# Patient Record
Sex: Male | Born: 1960
Health system: Southern US, Community
[De-identification: ages and names within clinical notes are randomized; demographics above are authoritative.]

## PROBLEM LIST (undated history)

## (undated) DIAGNOSIS — H409 Unspecified glaucoma: Secondary | ICD-10-CM

## (undated) DIAGNOSIS — H269 Unspecified cataract: Secondary | ICD-10-CM

## (undated) DIAGNOSIS — E039 Hypothyroidism, unspecified: Secondary | ICD-10-CM

## (undated) DIAGNOSIS — T7840XA Allergy, unspecified, initial encounter: Secondary | ICD-10-CM

## (undated) HISTORY — PX: ROTATOR CUFF REPAIR: SHX139

## (undated) HISTORY — PX: HEMORRHOID SURGERY: SHX153

## (undated) HISTORY — PX: HERNIA REPAIR: SHX51

## (undated) HISTORY — PX: VASECTOMY: SHX75

## (undated) HISTORY — DX: Unspecified glaucoma: H40.9

## (undated) HISTORY — DX: Allergy, unspecified, initial encounter: T78.40XA

## (undated) HISTORY — DX: Unspecified cataract: H26.9

## (undated) HISTORY — PX: FRACTURE SURGERY: SHX138

---

## 2006-01-21 ENCOUNTER — Ambulatory Visit: Payer: Self-pay | Admitting: Orthopaedic Surgery

## 2006-03-27 ENCOUNTER — Ambulatory Visit: Payer: Self-pay | Admitting: Orthopaedic Surgery

## 2008-05-12 ENCOUNTER — Ambulatory Visit: Payer: Self-pay | Admitting: Unknown Physician Specialty

## 2008-05-12 LAB — HM COLONOSCOPY

## 2008-07-25 DIAGNOSIS — D239 Other benign neoplasm of skin, unspecified: Secondary | ICD-10-CM

## 2008-07-25 HISTORY — DX: Other benign neoplasm of skin, unspecified: D23.9

## 2013-10-22 ENCOUNTER — Ambulatory Visit: Payer: Self-pay | Admitting: Unknown Physician Specialty

## 2013-10-22 LAB — HM COLONOSCOPY

## 2013-10-25 LAB — PATHOLOGY REPORT

## 2013-12-07 DIAGNOSIS — C4491 Basal cell carcinoma of skin, unspecified: Secondary | ICD-10-CM

## 2013-12-07 HISTORY — DX: Basal cell carcinoma of skin, unspecified: C44.91

## 2014-03-23 LAB — CBC AND DIFFERENTIAL
HCT: 42 % (ref 41–53)
Hemoglobin: 14.4 g/dL (ref 13.5–17.5)
Neutrophils Absolute: 4 /uL
PLATELETS: 360 10*3/uL (ref 150–399)
WBC: 6.6 10^3/mL

## 2014-03-23 LAB — HEPATIC FUNCTION PANEL
ALK PHOS: 51 U/L (ref 25–125)
ALT: 27 U/L (ref 10–40)
AST: 22 U/L (ref 14–40)

## 2014-03-23 LAB — BASIC METABOLIC PANEL
BUN: 17 mg/dL (ref 4–21)
CREATININE: 1 mg/dL (ref 0.6–1.3)
Glucose: 87 mg/dL
Potassium: 4.4 mmol/L (ref 3.4–5.3)
Sodium: 142 mmol/L (ref 137–147)

## 2014-03-23 LAB — PSA: PSA: 0.5

## 2014-03-23 LAB — TSH: TSH: 3.08 u[IU]/mL (ref 0.41–5.90)

## 2014-12-05 ENCOUNTER — Other Ambulatory Visit: Payer: Self-pay | Admitting: Family Medicine

## 2015-01-05 ENCOUNTER — Telehealth: Payer: Self-pay | Admitting: Family Medicine

## 2015-01-05 NOTE — Telephone Encounter (Signed)
Patient needs refills

## 2015-01-05 NOTE — Telephone Encounter (Signed)
Okay to refill for 5 months. Thanks

## 2015-01-05 NOTE — Telephone Encounter (Signed)
Pt contacted office for refill request on the following medications: Ambien 10 mg & Synthroid 75 mcg to [facility]. Pt is scheduled to have his CPE on 03/28/15. Pt was last seen on 03/23/14 for CPE. Thanks TNP

## 2015-01-06 ENCOUNTER — Other Ambulatory Visit: Payer: Self-pay

## 2015-01-06 MED ORDER — LEVOTHYROXINE SODIUM 75 MCG PO TABS: 75.0000 ug | ORAL_TABLET | Freq: Every day | ORAL | Status: DC

## 2015-01-06 MED ORDER — ZOLPIDEM TARTRATE 10 MG PO TABS: 10.0000 mg | ORAL_TABLET | Freq: Every evening | ORAL | Status: DC | PRN

## 2015-03-27 ENCOUNTER — Other Ambulatory Visit: Payer: Self-pay

## 2015-03-27 DIAGNOSIS — R599 Enlarged lymph nodes, unspecified: Secondary | ICD-10-CM | POA: Insufficient documentation

## 2015-03-27 DIAGNOSIS — G4700 Insomnia, unspecified: Secondary | ICD-10-CM | POA: Insufficient documentation

## 2015-03-27 DIAGNOSIS — E7800 Pure hypercholesterolemia, unspecified: Secondary | ICD-10-CM | POA: Insufficient documentation

## 2015-03-27 DIAGNOSIS — E039 Hypothyroidism, unspecified: Secondary | ICD-10-CM | POA: Insufficient documentation

## 2015-03-27 DIAGNOSIS — J3489 Other specified disorders of nose and nasal sinuses: Secondary | ICD-10-CM | POA: Insufficient documentation

## 2015-03-28 ENCOUNTER — Ambulatory Visit (INDEPENDENT_AMBULATORY_CARE_PROVIDER_SITE_OTHER): Payer: BLUE CROSS/BLUE SHIELD | Admitting: Family Medicine

## 2015-03-28 ENCOUNTER — Encounter: Payer: Self-pay | Admitting: Family Medicine

## 2015-03-28 VITALS — BP 102/68 | HR 56 | Temp 98.3°F | Resp 10 | Ht 75.4 in | Wt 188.0 lb

## 2015-03-28 DIAGNOSIS — Z23 Encounter for immunization: Secondary | ICD-10-CM | POA: Diagnosis not present

## 2015-03-28 DIAGNOSIS — Z0000 Encounter for general adult medical examination without abnormal findings: Secondary | ICD-10-CM | POA: Diagnosis not present

## 2015-03-28 DIAGNOSIS — Z125 Encounter for screening for malignant neoplasm of prostate: Secondary | ICD-10-CM | POA: Diagnosis not present

## 2015-03-28 DIAGNOSIS — Z1211 Encounter for screening for malignant neoplasm of colon: Secondary | ICD-10-CM

## 2015-03-28 DIAGNOSIS — J309 Allergic rhinitis, unspecified: Secondary | ICD-10-CM | POA: Insufficient documentation

## 2015-03-28 DIAGNOSIS — J3089 Other allergic rhinitis: Secondary | ICD-10-CM

## 2015-03-28 LAB — POCT URINALYSIS DIPSTICK
Bilirubin, UA: NEGATIVE
Blood, UA: NEGATIVE
GLUCOSE UA: NEGATIVE
Ketones, UA: NEGATIVE
Leukocytes, UA: NEGATIVE
NITRITE UA: NEGATIVE
PROTEIN UA: NEGATIVE
SPEC GRAV UA: 1.015
UROBILINOGEN UA: NEGATIVE
pH, UA: 5

## 2015-03-28 MED ORDER — ZOLPIDEM TARTRATE 10 MG PO TABS: 10.0000 mg | ORAL_TABLET | Freq: Every evening | ORAL | Status: DC | PRN

## 2015-03-28 NOTE — Progress Notes (Signed)
Patient ID: Charles Romero, male   DOB: 07/24/1960, 54 y.o.   MRN: [id number]  Visit Date: 03/28/2015  Today's Audree Schrecengost: Wilhemena Durie, MD   Chief Complaint  Patient presents with  . Annual Exam   Subjective:  Charles Romero is a 54 y.o. male who presents today for health maintenance and complete physical. He feels well. He reports exercising -does weights, swimming and running. He reports he is sleeping well. LAST: Colonoscopy 10/22/13-tubular adenoma repeat 5 years  Labs 03/23/14  Tdap 03/03/13  EKG 11/28/10  Review of Systems  Constitutional: Negative.   HENT: Negative.   Eyes: Negative.   Respiratory: Negative.   Cardiovascular: Negative.   Gastrointestinal: Negative.   Endocrine: Negative.   Genitourinary: Negative.   Musculoskeletal: Negative.   Skin: Negative.   Allergic/Immunologic: Negative.   Neurological: Negative.   Hematological: Negative.   Psychiatric/Behavioral: Negative.   Some terminal insomnia.  Social History   Social History  . Marital Status: Married    Spouse Name: N/A  . Number of Children: N/A  . Years of Education: N/A   Occupational History  . Not on file.   Social History Main Topics  . Smoking status: Never Smoker   . Smokeless tobacco: Never Used  . Alcohol Use: Yes     Comment: 3 drinks a week  . Drug Use: No  . Sexual Activity: Not on file   Other Topics Concern  . Not on file   Social History Narrative    Patient Active Problem List   Diagnosis Date Noted  . Allergic rhinitis 03/28/2015  . Enlarged lymph nodes 03/27/2015  . Hypercholesteremia 03/27/2015  . Adult hypothyroidism 03/27/2015  . Cannot sleep 03/27/2015  . Nasal obstruction 03/27/2015    Past Surgical History  Procedure Laterality Date  . Hemorrhoid surgery    . Rotator cuff repair    . Hernia repair    . Vasectomy      His family history includes Arthritis in his mother; Colon polyps in his father; Glaucoma in his mother; Kidney cancer in his  brother.    Outpatient Prescriptions Prior to Visit  Medication Sig Dispense Refill  . fluticasone (FLONASE) 50 MCG/ACT nasal spray Place into the nose.    . levothyroxine (SYNTHROID, LEVOTHROID) 75 MCG tablet Take 1 tablet (75 mcg total) by mouth daily. 30 tablet 6  . MULTIPLE VITAMIN PO Take by mouth.    . OMEGA-3 FATTY ACIDS PO Take by mouth.    . zolpidem (AMBIEN) 10 MG tablet Take 1 tablet (10 mg total) by mouth at bedtime as needed for sleep. 30 tablet 5   No facility-administered medications prior to visit.    Patient Care Team: Jerrol Banana., MD as PCP - General (Family Medicine)     Objective:   Vitals:  Filed Vitals:   03/28/15 1039  BP: 102/68  Pulse: 56  Temp: 98.3 F (36.8 C)  Resp: 10  Height: 6' 3.4" (1.915 m)  Weight: 188 lb (85.276 kg)    Physical Exam  Constitutional: He is oriented to person, place, and time. He appears well-developed and well-nourished.  HENT:  Head: Normocephalic and atraumatic.  Right Ear: External ear normal.  Left Ear: External ear normal.  Nose: Nose normal.  Mouth/Throat: Oropharynx is clear and moist.  Eyes: Conjunctivae and EOM are normal. Pupils are equal, round, and reactive to light.  Neck: Normal range of motion. Neck supple. No thyromegaly present.  Cardiovascular: Normal rate, regular rhythm and  normal heart sounds.   Pulmonary/Chest: Effort normal and breath sounds normal.  Abdominal: Soft.  Genitourinary: Rectum normal, prostate normal and penis normal.  Musculoskeletal: Normal range of motion.  Lymphadenopathy:    He has no cervical adenopathy.  Neurological: He is alert and oriented to person, place, and time. He has normal reflexes.  Skin: Skin is warm and dry.  Psychiatric: He has a normal mood and affect. His behavior is normal. Judgment and thought content normal.     Depression Screen PHQ 2/9 Scores 03/28/2015  PHQ - 2 Score 0      Assessment & Plan:   1. Annual physical exam - POCT  urinalysis dipstick  2. Prostate cancer screening  - POCT urinalysis dipstick  3. Colon cancer screening   4. Need for influenza vaccination  - Flu Vaccine QUAD 36+ mos IM 5.Mild Terminal Insomnia Controlled.  I have done the exam and reviewed the above chart and it is accurate to the best of my knowledge.

## 2015-04-17 ENCOUNTER — Telehealth: Payer: Self-pay | Admitting: Family Medicine

## 2015-04-17 NOTE — Telephone Encounter (Signed)
Pt stated that his company ordered lab and he faxed the results to our office for Dr. Rosanna Randy to review. Pt stated he faxed them last week and wanted to make sure Dr. Rosanna Randy had received them and ask if he should make any changes to his medications. Please advise. Thanks TNP

## 2015-04-18 NOTE — Telephone Encounter (Signed)
Do you know about this?

## 2015-04-18 NOTE — Telephone Encounter (Signed)
Patient is going to refax the labs for [facility] to review

## 2015-04-19 NOTE — Telephone Encounter (Signed)
Advised  ED 

## 2015-04-26 ENCOUNTER — Encounter: Payer: Self-pay | Admitting: Family Medicine

## 2015-06-23 ENCOUNTER — Encounter: Payer: Self-pay | Admitting: Family Medicine

## 2015-08-28 ENCOUNTER — Other Ambulatory Visit: Payer: Self-pay | Admitting: Family Medicine

## 2015-11-28 ENCOUNTER — Other Ambulatory Visit: Payer: Self-pay | Admitting: Family Medicine

## 2016-01-01 ENCOUNTER — Other Ambulatory Visit: Payer: Self-pay | Admitting: Family Medicine

## 2016-01-12 ENCOUNTER — Telehealth: Payer: Self-pay | Admitting: Emergency Medicine

## 2016-01-12 DIAGNOSIS — Z125 Encounter for screening for malignant neoplasm of prostate: Secondary | ICD-10-CM

## 2016-01-12 DIAGNOSIS — E7800 Pure hypercholesterolemia, unspecified: Secondary | ICD-10-CM

## 2016-01-12 DIAGNOSIS — E038 Other specified hypothyroidism: Secondary | ICD-10-CM

## 2016-01-12 NOTE — Telephone Encounter (Signed)
LMTCB. Tried to call pt. We got a letter from his insurance company stating he needs to have his TSH checked for his Levothyroxine. Dr. Rosanna Randy wanted him to have this done and do the rest of his labs at her CPE in the fall. The letter is on desk 213. Thanks.

## 2016-01-24 NOTE — Telephone Encounter (Signed)
lmtcb-aa 

## 2016-01-24 NOTE — Telephone Encounter (Signed)
Pt is returning call.  CB#[phone]/MW

## 2016-01-26 NOTE — Telephone Encounter (Signed)
Done-aa 

## 2016-01-26 NOTE — Telephone Encounter (Signed)
Pt advised, he wants to have all of his labs done de to he has a form that needs a panel done for it before October 1st and his CPE appt is very close to the cut off date, he will call me back and let me know exactly what panels he needs so we dont miss any-aa

## 2016-02-07 ENCOUNTER — Telehealth: Payer: Self-pay

## 2016-02-07 LAB — COMPREHENSIVE METABOLIC PANEL
ALBUMIN: 4.6 g/dL (ref 3.5–5.5)
ALT: 19 IU/L (ref 0–44)
AST: 21 IU/L (ref 0–40)
Albumin/Globulin Ratio: 2.3 — ABNORMAL HIGH (ref 1.2–2.2)
Alkaline Phosphatase: 47 IU/L (ref 39–117)
BILIRUBIN TOTAL: 0.6 mg/dL (ref 0.0–1.2)
BUN / CREAT RATIO: 16 (ref 9–20)
BUN: 16 mg/dL (ref 6–24)
CALCIUM: 9.4 mg/dL (ref 8.7–10.2)
CHLORIDE: 103 mmol/L (ref 96–106)
CO2: 26 mmol/L (ref 18–29)
CREATININE: 0.97 mg/dL (ref 0.76–1.27)
GFR, EST AFRICAN AMERICAN: 100 mL/min/{1.73_m2} (ref >59)
GFR, EST NON AFRICAN AMERICAN: 87 mL/min/{1.73_m2} (ref >59)
GLUCOSE: 97 mg/dL (ref 65–99)
Globulin, Total: 2 g/dL (ref 1.5–4.5)
Potassium: 4.4 mmol/L (ref 3.5–5.2)
Sodium: 142 mmol/L (ref 134–144)
TOTAL PROTEIN: 6.6 g/dL (ref 6.0–8.5)

## 2016-02-07 LAB — CBC WITH DIFFERENTIAL/PLATELET
BASOS ABS: 0.1 10*3/uL (ref 0.0–0.2)
Basos: 2 %
EOS (ABSOLUTE): 0.4 10*3/uL (ref 0.0–0.4)
Eos: 6 %
Hematocrit: 44.6 % (ref 37.5–51.0)
Hemoglobin: 14.7 g/dL (ref 12.6–17.7)
IMMATURE GRANULOCYTES: 1 %
Immature Grans (Abs): 0 10*3/uL (ref 0.0–0.1)
LYMPHS ABS: 2.2 10*3/uL (ref 0.7–3.1)
Lymphs: 37 %
MCH: 29.9 pg (ref 26.6–33.0)
MCHC: 33 g/dL (ref 31.5–35.7)
MCV: 91 fL (ref 79–97)
MONOS ABS: 0.6 10*3/uL (ref 0.1–0.9)
Monocytes: 10 %
NEUTROS PCT: 44 %
Neutrophils Absolute: 2.7 10*3/uL (ref 1.4–7.0)
PLATELETS: 355 10*3/uL (ref 150–379)
RBC: 4.92 x10E6/uL (ref 4.14–5.80)
RDW: 12.9 % (ref 12.3–15.4)
WBC: 5.9 10*3/uL (ref 3.4–10.8)

## 2016-02-07 LAB — LIPID PANEL WITH LDL/HDL RATIO
Cholesterol, Total: 205 mg/dL — ABNORMAL HIGH (ref 100–199)
HDL: 60 mg/dL (ref >39)
LDL CALC: 124 mg/dL — AB (ref 0–99)
LDl/HDL Ratio: 2.1 ratio units (ref 0.0–3.6)
Triglycerides: 104 mg/dL (ref 0–149)
VLDL CHOLESTEROL CAL: 21 mg/dL (ref 5–40)

## 2016-02-07 LAB — PSA: PROSTATE SPECIFIC AG, SERUM: 0.4 ng/mL (ref 0.0–4.0)

## 2016-02-07 LAB — TSH: TSH: 4.89 u[IU]/mL — AB (ref 0.450–4.500)

## 2016-02-07 MED ORDER — LEVOTHYROXINE SODIUM 88 MCG PO TABS: 88.0000 ug | ORAL_TABLET | Freq: Every day | ORAL | 5 refills | Status: DC

## 2016-02-07 NOTE — Telephone Encounter (Signed)
Advised patient of lab results. Sent in new dose of levothyroxine into the pharmacy.

## 2016-02-07 NOTE — Telephone Encounter (Signed)
-----   Message from Jerrol Banana., MD sent at 02/07/2016  8:15 AM EDT ----- Increase synthrouid from 75-32mcg daily.

## 2016-03-28 ENCOUNTER — Encounter: Payer: Self-pay | Admitting: Family Medicine

## 2016-03-28 ENCOUNTER — Ambulatory Visit (INDEPENDENT_AMBULATORY_CARE_PROVIDER_SITE_OTHER): Payer: BLUE CROSS/BLUE SHIELD | Admitting: Family Medicine

## 2016-03-28 VITALS — BP 108/60 | HR 64 | Temp 97.7°F | Resp 16 | Ht 73.0 in | Wt 181.0 lb

## 2016-03-28 DIAGNOSIS — Z23 Encounter for immunization: Secondary | ICD-10-CM | POA: Diagnosis not present

## 2016-03-28 DIAGNOSIS — Z0000 Encounter for general adult medical examination without abnormal findings: Secondary | ICD-10-CM | POA: Diagnosis not present

## 2016-03-28 DIAGNOSIS — Z125 Encounter for screening for malignant neoplasm of prostate: Secondary | ICD-10-CM | POA: Diagnosis not present

## 2016-03-28 DIAGNOSIS — Z1211 Encounter for screening for malignant neoplasm of colon: Secondary | ICD-10-CM

## 2016-03-28 LAB — POCT URINALYSIS DIPSTICK
Bilirubin, UA: NEGATIVE
Blood, UA: NEGATIVE
Glucose, UA: NEGATIVE
Ketones, UA: NEGATIVE
Leukocytes, UA: NEGATIVE
Nitrite, UA: NEGATIVE
Protein, UA: NEGATIVE
Spec Grav, UA: 1.02
Urobilinogen, UA: 0.2
pH, UA: 5

## 2016-03-28 LAB — IFOBT (OCCULT BLOOD): IFOBT: NEGATIVE

## 2016-03-28 NOTE — Progress Notes (Signed)
Patient: Charles Romero, Male    DOB: 1961/06/12, 55 y.o.   MRN: [id number] Visit Date: 03/28/2016  Today's Provider: Wilhemena Durie, MD   Chief Complaint  Patient presents with  . Annual Exam   Subjective:    Annual physical exam Charles Romero is a 55 y.o. male who presents today for health maintenance and complete physical. He feels well. He reports exercising about 3-4 days a week. He reports he is sleeping fairly well with the help of Ambien.Patient has a vigorous exercise program.  ----------------------------------------------------------------- Colonoscopy- 10/22/13 Tubular adenoma  Immunization History  Administered Date(s) Administered  . Hepatitis A 12/25/2004, 03/03/2013  . Influenza,inj,Quad PF,36+ Mos 03/28/2015  . Td 10/04/2004  . Tdap 03/03/2013     Review of Systems  Constitutional: Negative.   HENT: Negative.   Eyes: Negative.   Respiratory: Negative.   Cardiovascular: Negative.   Gastrointestinal: Positive for blood in stool.       Occasional pink/small amount of blood on toilet paper. This is been going on for years.  Endocrine: Negative.   Genitourinary: Negative.        Nocturia 1  Musculoskeletal: Positive for back pain and neck stiffness.  Skin: Negative.   Allergic/Immunologic: Negative.   Neurological: Negative.   Hematological: Negative.   Psychiatric/Behavioral: Negative.     Social History      He  reports that he has never smoked. He has never used smokeless tobacco. He reports that he drinks alcohol. He reports that he does not use drugs.       Social History   Social History  . Marital status: Married    Spouse name: N/A  . Number of children: N/A  . Years of education: N/A   Social History Main Topics  . Smoking status: Never Smoker  . Smokeless tobacco: Never Used  . Alcohol use Yes     Comment: 3 drinks a week  . Drug use: No  . Sexual activity: Not Asked   Other Topics Concern  . None   Social  History Narrative  . None    History reviewed. No pertinent past medical history.   Patient Active Problem List   Diagnosis Date Noted  . Allergic rhinitis 03/28/2015  . Enlarged lymph nodes 03/27/2015  . Hypercholesteremia 03/27/2015  . Adult hypothyroidism 03/27/2015  . Cannot sleep 03/27/2015  . Nasal obstruction 03/27/2015    Past Surgical History:  Procedure Laterality Date  . HEMORRHOID SURGERY    . HERNIA REPAIR    . ROTATOR CUFF REPAIR    . VASECTOMY      Family History        Family Status  Relation Status  . Mother Deceased  . Father Alive  . Brother Alive  . Brother Alive        His family history includes Arthritis in his mother; Colon polyps in his father; Glaucoma in his mother; Kidney cancer in his brother.    No Known Allergies  Current Meds  Medication Sig  . levothyroxine (SYNTHROID, LEVOTHROID) 88 MCG tablet Take 1 tablet (88 mcg total) by mouth daily.  . MULTIPLE VITAMIN PO Take by mouth.  . OMEGA-3 FATTY ACIDS PO Take by mouth.  . zolpidem (AMBIEN) 10 MG tablet TAKE ONE TABLET BY MOUTH AT BEDTIME AS NEEDED FOR SLEEP    Patient Care Team: Jerrol Banana., MD as PCP - General (Family Medicine)     Objective:   Vitals: BP  108/60 (BP Location: Left Arm, Patient Position: Sitting, Cuff Size: Normal)   Pulse 64   Temp 97.7 F (36.5 C) (Oral)   Resp 16   Ht 6\' 1"  (1.854 m)   Wt 181 lb (82.1 kg)   BMI 23.88 kg/m    Physical Exam  Constitutional: He is oriented to person, place, and time. He appears well-developed and well-nourished.  HENT:  Head: Normocephalic and atraumatic.  Right Ear: External ear normal.  Left Ear: External ear normal.  Nose: Nose normal.  Mouth/Throat: Oropharynx is clear and moist.  Eyes: Conjunctivae and EOM are normal. Pupils are equal, round, and reactive to light.  Neck: Normal range of motion. Neck supple.  Cardiovascular: Normal rate, regular rhythm, normal heart sounds and intact distal pulses.     Pulmonary/Chest: Effort normal and breath sounds normal.  Abdominal: Soft. Bowel sounds are normal.  Genitourinary: Rectum normal, prostate normal and penis normal.  Musculoskeletal: Normal range of motion.  Neurological: He is alert and oriented to person, place, and time. He has normal reflexes.  Skin: Skin is warm and dry.  Psychiatric: He has a normal mood and affect. His behavior is normal. Judgment and thought content normal.     Depression Screen PHQ 2/9 Scores 03/28/2015  PHQ - 2 Score 0      Assessment & Plan:     Routine Health Maintenance and Physical Exam  Exercise Activities and Dietary recommendations Goals    None      Immunization History  Administered Date(s) Administered  . Hepatitis A 12/25/2004, 03/03/2013  . Influenza,inj,Quad PF,36+ Mos 03/28/2015  . Td 10/04/2004  . Tdap 03/03/2013    Health Maintenance  Topic Date Due  . Hepatitis C Screening  15-Oct-1960  . HIV Screening  09/09/1974  . INFLUENZA VACCINE  01/30/2016  . COLONOSCOPY  05/12/2018  . TETANUS/TDAP  03/04/2023     Patient will have a colonoscopy 2018-2020. Discussed health benefits of physical activity, and encouraged him to engage in regular exercise appropriate for his age and condition.   Actinic keratosis Normal skin exam today. He does see dermatology/Dr. Nehemiah Massed every 6 months. Mild chronic insomnia --------------------------------------------------------------------   I have done the exam and reviewed the chart and it is accurate to the best of my knowledge. Miguel Aschoff M.D. [place], MD  [facility]

## 2016-07-12 ENCOUNTER — Other Ambulatory Visit: Payer: Self-pay | Admitting: Family Medicine

## 2016-07-29 ENCOUNTER — Telehealth: Payer: Self-pay | Admitting: Family Medicine

## 2016-07-29 ENCOUNTER — Other Ambulatory Visit: Payer: Self-pay | Admitting: Family Medicine

## 2016-07-29 NOTE — Telephone Encounter (Signed)
Please review-aa, also you have RX refill request on this medication in RX box

## 2016-07-29 NOTE — Telephone Encounter (Signed)
Pt states the pharmacy has not rec'd a refill request for zolpidem (AMBIEN) 10 MG tablet.  [facility].  CB#[phone]/MW

## 2016-07-29 NOTE — Telephone Encounter (Signed)
Done-aa 

## 2016-08-07 ENCOUNTER — Emergency Department
Admission: EM | Admit: 2016-08-07 | Discharge: 2016-08-07 | Disposition: A | Discharge status: Home / Self Care | Payer: BLUE CROSS/BLUE SHIELD | Attending: Emergency Medicine | Admitting: Emergency Medicine

## 2016-08-07 ENCOUNTER — Emergency Department: Payer: BLUE CROSS/BLUE SHIELD

## 2016-08-07 ENCOUNTER — Encounter: Payer: Self-pay | Admitting: Emergency Medicine

## 2016-08-07 DIAGNOSIS — R609 Edema, unspecified: Secondary | ICD-10-CM

## 2016-08-07 DIAGNOSIS — M79605 Pain in left leg: Secondary | ICD-10-CM | POA: Diagnosis not present

## 2016-08-07 DIAGNOSIS — R55 Syncope and collapse: Secondary | ICD-10-CM | POA: Diagnosis not present

## 2016-08-07 DIAGNOSIS — E039 Hypothyroidism, unspecified: Secondary | ICD-10-CM | POA: Diagnosis not present

## 2016-08-07 LAB — BASIC METABOLIC PANEL
Anion gap: 8 (ref 5–15)
BUN: 19 mg/dL (ref 6–20)
CHLORIDE: 107 mmol/L (ref 101–111)
CO2: 25 mmol/L (ref 22–32)
CREATININE: 0.98 mg/dL (ref 0.61–1.24)
Calcium: 9 mg/dL (ref 8.9–10.3)
GFR calc Af Amer: >60 mL/min (ref >60)
GFR calc non Af Amer: >60 mL/min (ref >60)
GLUCOSE: 121 mg/dL — AB (ref 65–99)
POTASSIUM: 3.8 mmol/L (ref 3.5–5.1)
SODIUM: 140 mmol/L (ref 135–145)

## 2016-08-07 LAB — COMPREHENSIVE METABOLIC PANEL
ALK PHOS: 37 U/L — AB (ref 38–126)
ALT: 25 U/L (ref 17–63)
ANION GAP: 8 (ref 5–15)
AST: 29 U/L (ref 15–41)
Albumin: 4.2 g/dL (ref 3.5–5.0)
BUN: 18 mg/dL (ref 6–20)
CALCIUM: 8.7 mg/dL — AB (ref 8.9–10.3)
CO2: 23 mmol/L (ref 22–32)
CREATININE: 0.98 mg/dL (ref 0.61–1.24)
Chloride: 107 mmol/L (ref 101–111)
Glucose, Bld: 120 mg/dL — ABNORMAL HIGH (ref 65–99)
Potassium: 4.1 mmol/L (ref 3.5–5.1)
Sodium: 138 mmol/L (ref 135–145)
TOTAL PROTEIN: 6.5 g/dL (ref 6.5–8.1)
Total Bilirubin: 0.9 mg/dL (ref 0.3–1.2)

## 2016-08-07 LAB — CBC WITH DIFFERENTIAL/PLATELET
BASOS ABS: 0.1 10*3/uL (ref 0–0.1)
BASOS PCT: 1 %
EOS ABS: 0.3 10*3/uL (ref 0–0.7)
Eosinophils Relative: 2 %
HCT: 43 % (ref 40.0–52.0)
HEMOGLOBIN: 14.7 g/dL (ref 13.0–18.0)
Lymphocytes Relative: 16 %
Lymphs Abs: 2.4 10*3/uL (ref 1.0–3.6)
MCH: 30.4 pg (ref 26.0–34.0)
MCHC: 34.3 g/dL (ref 32.0–36.0)
MCV: 88.8 fL (ref 80.0–100.0)
MONOS PCT: 4 %
Monocytes Absolute: 0.6 10*3/uL (ref 0.2–1.0)
NEUTROS ABS: 11.4 10*3/uL — AB (ref 1.4–6.5)
NEUTROS PCT: 77 %
Platelets: 300 10*3/uL (ref 150–440)
RBC: 4.84 MIL/uL (ref 4.40–5.90)
RDW: 12.6 % (ref 11.5–14.5)
WBC: 14.7 10*3/uL — AB (ref 3.8–10.6)

## 2016-08-07 LAB — TROPONIN I: Troponin I: <0.03 ng/mL (ref <0.03)

## 2016-08-07 NOTE — ED Notes (Signed)
Pt back from x-ray.

## 2016-08-07 NOTE — ED Provider Notes (Signed)
[facility] Emergency Department Provider Note   ____________________________________________   First MD Initiated Contact with Patient 08/07/16 1246     (approximate)  I have reviewed the triage vital signs and the nursing notes.   HISTORY  Chief Complaint Leg Pain and Loss of Consciousness   HPI Charles Romero is a 56 y.o. male who reports this morning before he took a shower he was walking fast and ran into the corner of his sleigh bed with his left thigh. He said it hurt a lot. He was able to continue with his taking a shower went to work walked around up and down multiple times and began to hurt more significant Aleve and was standing doing a meat and agreed and began to get lightheaded when into the next room and very sweaty and passed out. He has no other known problems and no other injuries he did not hit his head has no headache or neck pain.  } History reviewed. No pertinent past medical history.  Patient Active Problem List   Diagnosis Date Noted  . Allergic rhinitis 03/28/2015  . Enlarged lymph nodes 03/27/2015  . Hypercholesteremia 03/27/2015  . Adult hypothyroidism 03/27/2015  . Cannot sleep 03/27/2015  . Nasal obstruction 03/27/2015    Past Surgical History:  Procedure Laterality Date  . HEMORRHOID SURGERY    . HERNIA REPAIR    . ROTATOR CUFF REPAIR    . VASECTOMY      Prior to Admission medications   Medication Sig Start Date End Date Taking? Authorizing Provider  levothyroxine (SYNTHROID, LEVOTHROID) 88 MCG tablet Take 1 tablet (88 mcg total) by mouth daily. 02/07/16   Richard Maceo Pro., MD  MULTIPLE VITAMIN PO Take by mouth.    Historical Provider, MD  OMEGA-3 FATTY ACIDS PO Take by mouth.    Historical Provider, MD  zolpidem (AMBIEN) 10 MG tablet TAKE ONE TABLET BY MOUTH AT BEDTIME AS NEEDED FOR SLEEP 07/29/16   Richard Maceo Pro., MD    Allergies Patient has no known allergies.  Family History  Problem Relation  Age of Onset  . Arthritis Mother   . Glaucoma Mother   . Colon polyps Father   . Kidney cancer Brother     Social History Social History  Substance Use Topics  . Smoking status: Never Smoker  . Smokeless tobacco: Never Used  . Alcohol use Yes     Comment: 3 drinks a week    Review of Systems Constitutional: No fever/chills Eyes: No visual changes. ENT: No sore throat. Cardiovascular: Denies chest pain. Respiratory: Denies shortness of breath. Gastrointestinal: No abdominal pain.  No nausea, no vomiting.  No diarrhea.  No constipation. Genitourinary: Negative for dysuria. Musculoskeletal: Negative for back pain. Skin: Negative for rash.  10-point ROS otherwise negative.  ____________________________________________   PHYSICAL EXAM:  VITAL SIGNS: ED Triage Vitals [08/07/16 1238]  Enc Vitals Group     BP 115/65     Pulse Rate 83     Resp 18     Temp 98.4 F (36.9 C)     Temp Source Oral     SpO2 97 %     Weight 180 lb (81.6 kg)     Height 6' (1.829 m)     Head Circumference      Peak Flow      Pain Score      Pain Loc      Pain Edu?      Excl. in [place]?  Constitutional: Alert and oriented. Well appearing and in no acute distress. Eyes: Conjunctivae are normal. PERRL. EOMI. Head: Atraumatic. Nose: No congestion/rhinnorhea. Mouth/Throat: Mucous membranes are moist.  Oropharynx non-erythematous. Neck: No stridor.   Cardiovascular: Normal rate, regular rhythm. Grossly normal heart sounds.  Good peripheral circulation. Respiratory: Normal respiratory effort.  No retractions. Lungs CTAB. Gastrointestinal: Soft and nontender. No distention. No abdominal bruits. No CVA tenderness. Musculoskeletal: Patient is a bruise on his left thigh. There is tenderness over that area but there is no tenderness on palpation of the bone medially in the thigh. Neurologic:  Normal speech and language. No gross focal neurologic deficits are  appreciated.   ____________________________________________   LABS (all labs ordered are listed, but only abnormal results are displayed)  Labs Reviewed  BASIC METABOLIC PANEL - Abnormal; Notable for the following:       Result Value   Glucose, Bld 121 (*)    All other components within normal limits  COMPREHENSIVE METABOLIC PANEL - Abnormal; Notable for the following:    Glucose, Bld 120 (*)    Calcium 8.7 (*)    Alkaline Phosphatase 37 (*)    All other components within normal limits  CBC WITH DIFFERENTIAL/PLATELET - Abnormal; Notable for the following:    WBC 14.7 (*)    Neutro Abs 11.4 (*)    All other components within normal limits  TROPONIN I  URINALYSIS, COMPLETE (UACMP) WITH MICROSCOPIC  CBG MONITORING, ED   ____________________________________________  EKG  EKG read and interpreted by me shows normal sinus rhythm a rate of 79 normal axis no acute ST-T wave changes baseline is somewhat irregular due to interference ____________________________________________  RADIOLOGY Study Result   CLINICAL DATA:  Syncope and diaphoresis  EXAM: CHEST  2 VIEW  COMPARISON:  None.  FINDINGS: The heart size and mediastinal contours are within normal limits. Both lungs are clear. Lungs are slightly hyperinflated. No effusion, pulmonary edema or pneumothorax. Old right mid clavicular fracture. No acute osseous abnormality.  IMPRESSION: No active cardiopulmonary disease.   Electronically Signed   By: Ashley Royalty M.D.   On: 08/07/2016 13:47    Study Result   CLINICAL DATA:  Syncopal episode with swelling of the benign.  EXAM: LEFT FEMUR 2 VIEWS  COMPARISON:  None.  FINDINGS: There is no evidence of fracture or other focal bone lesions. No focal soft tissue mass or mineralization. Hip and knee joints are maintained. Visualized pubic rami appear intact. Left SI joint and pubic symphysis are  unremarkable.  IMPRESSION: Negative.   Electronically Signed   By: Ashley Royalty M.D.   On: 08/07/2016 13:49      ____________________________________________   PROCEDURES  Procedure(s) performed:  Procedures  Critical Care performed  ____________________________________________   INITIAL IMPRESSION / ASSESSMENT AND PLAN / ED COURSE  Pertinent labs & imaging results that were available during my care of the patient were reviewed by me and considered in my medical decision making (see chart for details).          ____________________________________________   FINAL CLINICAL IMPRESSION(S) / ED DIAGNOSES  Final diagnoses:  Left leg pain  Syncope, unspecified syncope type      NEW MEDICATIONS STARTED DURING THIS VISIT:  New Prescriptions   No medications on file     Note:  This document was prepared using Dragon voice recognition software and may include unintentional dictation errors.    Nena Polio, MD 08/07/16 [phone]

## 2016-08-07 NOTE — ED Triage Notes (Signed)
Pt comes into the ED via EMS from work where he had a syncopal episode.  Patient states he remembers becoming diaphoretic before he passed out and he knew he was getting ready to pass out.  Patient denies hitting his head or falling in the process of the syncopal episode.  Patient ran into the corner of his bed this morning and has swelling noted to the left upper thigh.  Patient states it has been difficult to ambulate with the leg since this happened.

## 2016-08-07 NOTE — Discharge Instructions (Signed)
Please keep off your leg as much as possible today you can get up of course to go to the bathroom or shower but otherwise I would rest and keep the leg elevated. You can put ice on the bruised area 20 minutes every hour. Make sure the ice is wrapped up in a towel to keep the ice itself HER skin. Do not fall asleep on the ice as you can get frostbite. Tomorrow you can use either ice or heat whichever feels better again do not fall asleep with the ice or heat in place as you can get burns or frostbite. I would use Tylenol or Aleve as discussed for the pain. Please follow-up with your doctor within a week. Please return for increased pain or swelling especially swelling below the knee.

## 2016-08-12 ENCOUNTER — Ambulatory Visit (INDEPENDENT_AMBULATORY_CARE_PROVIDER_SITE_OTHER): Payer: BLUE CROSS/BLUE SHIELD | Admitting: Family Medicine

## 2016-08-12 ENCOUNTER — Encounter: Payer: Self-pay | Admitting: Family Medicine

## 2016-08-12 VITALS — BP 118/74 | HR 64 | Temp 98.2°F | Resp 16 | Wt 190.0 lb

## 2016-08-12 DIAGNOSIS — E039 Hypothyroidism, unspecified: Secondary | ICD-10-CM | POA: Diagnosis not present

## 2016-08-12 DIAGNOSIS — R55 Syncope and collapse: Secondary | ICD-10-CM

## 2016-08-12 DIAGNOSIS — R5383 Other fatigue: Secondary | ICD-10-CM

## 2016-08-12 DIAGNOSIS — G4700 Insomnia, unspecified: Secondary | ICD-10-CM | POA: Diagnosis not present

## 2016-08-12 DIAGNOSIS — S7012XD Contusion of left thigh, subsequent encounter: Secondary | ICD-10-CM | POA: Diagnosis not present

## 2016-08-12 NOTE — Patient Instructions (Signed)
Start a full strength aspirin 325mg  a day while on vacation.

## 2016-08-12 NOTE — Progress Notes (Signed)
Patient: Charles Romero Male    DOB: 1961-01-16   56 y.o.   MRN: [id number] Visit Date: 08/12/2016  Today's Provider: Wilhemena Durie, MD   Chief Complaint  Patient presents with  . Follow-up  . Leg Injury        Subjective:    Leg Pain   The incident occurred 5 to 7 days ago. The incident occurred at home. The injury mechanism was a direct blow. The pain is present in the left thigh. The quality of the pain is described as aching. The pain is at a severity of 0/10 (While sitting down.  Pain worses as he tries to stand up. ). Associated symptoms include a loss of motion and muscle weakness. Pertinent negatives include no inability to bear weight, loss of sensation, numbness or tingling. He reports no foreign bodies present. The symptoms are aggravated by weight bearing and palpation (Going from a sitting to standing position). He has tried ice for the symptoms.    No Known Allergies   Current Outpatient Prescriptions:  .  levothyroxine (SYNTHROID, LEVOTHROID) 88 MCG tablet, Take 1 tablet (88 mcg total) by mouth daily., Disp: 30 tablet, Rfl: 5 .  MULTIPLE VITAMIN PO, Take by mouth., Disp: , Rfl:  .  OMEGA-3 FATTY ACIDS PO, Take by mouth., Disp: , Rfl:  .  zolpidem (AMBIEN) 10 MG tablet, TAKE ONE TABLET BY MOUTH AT BEDTIME AS NEEDED FOR SLEEP, Disp: 30 tablet, Rfl: 5  Review of Systems  Constitutional: Positive for fatigue. Negative for activity change, appetite change, chills, diaphoresis, fever and unexpected weight change.  Respiratory: Negative.   Cardiovascular: Negative.   Musculoskeletal: Positive for myalgias. Negative for arthralgias, back pain, gait problem, joint swelling, neck pain and neck stiffness.  Neurological: Positive for syncope (Only "past out" on 08/07/2016. ). Negative for dizziness, tingling, seizures, light-headedness, numbness and headaches.  Hematological: Does not bruise/bleed easily.    Social History  Substance Use Topics  . Smoking  status: Never Smoker  . Smokeless tobacco: Never Used  . Alcohol use Yes     Comment: 3 drinks a week   Objective:   BP 118/74 (BP Location: Left Arm, Patient Position: Sitting, Cuff Size: Normal)   Pulse 64   Temp 98.2 F (36.8 C) (Oral)   Resp 16   Wt 190 lb (86.2 kg)   SpO2 97%   BMI 25.77 kg/m   Physical Exam  Constitutional: He is oriented to person, place, and time. He appears well-developed and well-nourished.  HENT:  Head: Normocephalic and atraumatic.  Right Ear: External ear normal.  Left Ear: External ear normal.  Nose: Nose normal.  Eyes: Conjunctivae are normal. No scleral icterus.  Neck: No thyromegaly present.  Cardiovascular: Normal rate, regular rhythm and normal heart sounds.   Pulmonary/Chest: Effort normal and breath sounds normal.  Abdominal: Soft.  Musculoskeletal:  Mild diffuse swelling on left thigh.  21 1/2in  left thigh 21 in  right thigh  Neurological: He is alert and oriented to person, place, and time.  Skin: Skin is warm and dry.  Psychiatric: He has a normal mood and affect. His behavior is normal. Judgment and thought content normal.        Assessment & Plan:     1. Contusion of left thigh, subsequent encounter ER visit 08/07/2016 to rule out a femur fracture.  Counsel pt on symptoms of a potential blood clot.  Advised pt to walk around some half way through  is flight to [place].   I think this is a deep contusion of the thigh. Offered orthopedic referral but he declines that I do not think this is necessary at this point in time. I do not think there is an underlying fracture.  2. Insomnia, unspecified type Worsening recently; tries not to use Ambien regularly.  He is using Zquil OTC and that seems to be helping.  Advised pt to call if worsening or does not improve.   3. Adult hypothyroidism Will recheck labs; his Synthroid was increased to 62mcg in August 2017. - TSH  4. Fatigue, unspecified type New problem, could be secondary  to insomnia but will check labs as below.  - Testosterone  5. Vasovagal syncope Suspect secondary to pain from leg injury.  Advised pt if he has another episode will consider a referral to Cardiology and Neurology.  I feel strongly this was vasovagal.  Patient seen and examined by Miguel Aschoff, MD, and note scribed by Ashley Royalty, CMA I have done the exam and reviewed the chart and it is accurate to the best of my knowledge. Development worker, community has been used and  any errors in dictation or transcription are unintentional. Miguel Aschoff M.D. [place], MD  [facility]

## 2016-08-16 LAB — TSH: TSH: 5.7 u[IU]/mL — ABNORMAL HIGH (ref 0.450–4.500)

## 2016-08-16 LAB — TESTOSTERONE: TESTOSTERONE: 260 ng/dL — AB (ref 264–916)

## 2016-08-19 ENCOUNTER — Other Ambulatory Visit: Payer: Self-pay | Admitting: Emergency Medicine

## 2016-08-19 DIAGNOSIS — E039 Hypothyroidism, unspecified: Secondary | ICD-10-CM

## 2016-08-19 MED ORDER — LEVOTHYROXINE SODIUM 100 MCG PO TABS
100.0000 ug | ORAL_TABLET | Freq: Every day | ORAL | 5 refills | Status: DC
Start: 2016-08-19 — End: 2017-02-25

## 2017-02-25 ENCOUNTER — Other Ambulatory Visit: Payer: Self-pay | Admitting: Family Medicine

## 2017-02-25 DIAGNOSIS — E039 Hypothyroidism, unspecified: Secondary | ICD-10-CM

## 2017-03-04 ENCOUNTER — Encounter: Payer: Self-pay | Admitting: Emergency Medicine

## 2017-03-05 ENCOUNTER — Encounter: Payer: Self-pay | Admitting: Family Medicine

## 2017-03-05 ENCOUNTER — Ambulatory Visit (INDEPENDENT_AMBULATORY_CARE_PROVIDER_SITE_OTHER): Payer: BLUE CROSS/BLUE SHIELD | Admitting: Family Medicine

## 2017-03-05 VITALS — BP 100/68 | HR 56 | Temp 98.1°F | Resp 16 | Ht 72.0 in | Wt 194.0 lb

## 2017-03-05 DIAGNOSIS — Z23 Encounter for immunization: Secondary | ICD-10-CM | POA: Diagnosis not present

## 2017-03-05 DIAGNOSIS — Z0000 Encounter for general adult medical examination without abnormal findings: Secondary | ICD-10-CM | POA: Diagnosis not present

## 2017-03-05 DIAGNOSIS — Z1211 Encounter for screening for malignant neoplasm of colon: Secondary | ICD-10-CM

## 2017-03-05 DIAGNOSIS — Z125 Encounter for screening for malignant neoplasm of prostate: Secondary | ICD-10-CM

## 2017-03-05 LAB — POCT URINALYSIS DIPSTICK
Bilirubin, UA: NEGATIVE
Glucose, UA: NEGATIVE
Ketones, UA: NEGATIVE
Leukocytes, UA: NEGATIVE
NITRITE UA: NEGATIVE
PROTEIN UA: NEGATIVE
RBC UA: NEGATIVE
SPEC GRAV UA: 1.025 (ref 1.010–1.025)
UROBILINOGEN UA: 0.2 U/dL
pH, UA: 6 (ref 5.0–8.0)

## 2017-03-05 LAB — IFOBT (OCCULT BLOOD): IMMUNOLOGICAL FECAL OCCULT BLOOD TEST: NEGATIVE

## 2017-03-05 NOTE — Progress Notes (Signed)
Patient: Charles Romero, Male    DOB: Jun 05, 1961, 56 y.o.   MRN: [id number] Visit Date: 03/05/2017  Today's Provider: Wilhemena Durie, MD   Chief Complaint  Patient presents with  . Annual Exam   Subjective:    Annual physical exam Charles Romero is a 56 y.o. male who presents today for health maintenance and complete physical. He feels well. He reports exercising 3-4 days a week. He reports he is sleeping fairly well, he has been taking melatonin and Z-quil for sleep instead of Ambien.Married for 32 years. 2 children,daughter married and lives in [place] lives in [place].  ----------------------------------------------------------------- Colonoscopy- 10/22/13 internal hemorrhoids, tubular adenoma repeat 2020  Immunization History  Administered Date(s) Administered  . Hepatitis A 12/25/2004, 03/03/2013  . Influenza,inj,Quad PF,6+ Mos 03/28/2015, 03/28/2016  . Td 10/04/2004  . Tdap 03/03/2013      Review of Systems  Constitutional: Negative.   HENT: Negative.   Eyes: Negative.   Respiratory: Negative.   Cardiovascular: Negative.   Gastrointestinal: Negative.   Endocrine: Negative.   Genitourinary: Negative.   Musculoskeletal: Negative.   Skin: Negative.   Allergic/Immunologic: Negative.   Neurological: Negative.   Hematological: Negative.   Psychiatric/Behavioral: Negative.     Social History      He  reports that he has never smoked. He has never used smokeless tobacco. He reports that he drinks alcohol. He reports that he does not use drugs.       Social History   Social History  . Marital status: Married    Spouse name: N/A  . Number of children: N/A  . Years of education: N/A   Social History Main Topics  . Smoking status: Never Smoker  . Smokeless tobacco: Never Used  . Alcohol use Yes     Comment: 3 drinks a week  . Drug use: No  . Sexual activity: Not Asked   Other Topics Concern  . None   Social History Narrative  . None     History reviewed. No pertinent past medical history.   Patient Active Problem List   Diagnosis Date Noted  . Allergic rhinitis 03/28/2015  . Enlarged lymph nodes 03/27/2015  . Hypercholesteremia 03/27/2015  . Adult hypothyroidism 03/27/2015  . Cannot sleep 03/27/2015  . Nasal obstruction 03/27/2015    Past Surgical History:  Procedure Laterality Date  . HEMORRHOID SURGERY    . HERNIA REPAIR    . ROTATOR CUFF REPAIR    . VASECTOMY      Family History        Family Status  Relation Status  . Mother Deceased  . Father Alive  . Brother Alive  . Brother Alive        His family history includes Arthritis in his mother; Colon polyps in his father; Glaucoma in his mother; Kidney cancer in his brother.     No Known Allergies   Current Outpatient Prescriptions:  .  levothyroxine (SYNTHROID, LEVOTHROID) 100 MCG tablet, TAKE 1 TABLET EVERY DAY, Disp: 30 tablet, Rfl: 12 .  MULTIPLE VITAMIN PO, Take by mouth., Disp: , Rfl:  .  OMEGA-3 FATTY ACIDS PO, Take by mouth., Disp: , Rfl:  .  zolpidem (AMBIEN) 10 MG tablet, TAKE ONE TABLET BY MOUTH AT BEDTIME AS NEEDED FOR SLEEP (Patient not taking: Reported on 03/05/2017), Disp: 30 tablet, Rfl: 5   Patient Care Team: Jerrol Banana., MD as PCP - General (Family Medicine)      Objective:  Vitals: BP 100/68 (BP Location: Left Arm, Patient Position: Sitting, Cuff Size: Normal)   Pulse (!) 56   Temp 98.1 F (36.7 C) (Oral)   Resp 16   Ht 6' (1.829 m)   Wt 194 lb (88 kg)   BMI 26.31 kg/m    Vitals:   03/05/17 0917  BP: 100/68  Pulse: (!) 56  Resp: 16  Temp: 98.1 F (36.7 C)  TempSrc: Oral  Weight: 194 lb (88 kg)  Height: 6' (1.829 m)     Physical Exam  Constitutional: He is oriented to person, place, and time. He appears well-developed and well-nourished.  HENT:  Head: Normocephalic and atraumatic.  Right Ear: External ear normal.  Left Ear: External ear normal.  Nose: Nose normal.  Mouth/Throat: Oropharynx  is clear and moist.  Eyes: Pupils are equal, round, and reactive to light. Conjunctivae and EOM are normal.  Neck: Normal range of motion. Neck supple.  Cardiovascular: Normal rate, regular rhythm, normal heart sounds and intact distal pulses.   Pulmonary/Chest: Effort normal and breath sounds normal.  Abdominal: Soft. Bowel sounds are normal.  Genitourinary: Rectum normal, prostate normal and penis normal.  Musculoskeletal: Normal range of motion.  Neurological: He is alert and oriented to person, place, and time. He has normal reflexes.  Skin: Skin is warm and dry.  Psychiatric: He has a normal mood and affect. His behavior is normal. Judgment and thought content normal.     Depression Screen PHQ 2/9 Scores 03/05/2017 03/28/2015  PHQ - 2 Score 0 0  PHQ- 9 Score 0 -      Assessment & Plan:     Routine Health Maintenance and Physical Exam  Exercise Activities and Dietary recommendations Goals    None      Immunization History  Administered Date(s) Administered  . Hepatitis A 12/25/2004, 03/03/2013  . Influenza,inj,Quad PF,6+ Mos 03/28/2015, 03/28/2016  . Td 10/04/2004  . Tdap 03/03/2013    Health Maintenance  Topic Date Due  . HIV Screening  09/09/1974  . INFLUENZA VACCINE  01/29/2017  . COLONOSCOPY  10/23/2018  . TETANUS/TDAP  03/04/2023  . Hepatitis C Screening  Completed     Discussed health benefits of physical activity, and encouraged him to engage in regular exercise appropriate for his age and condition.    --------------------------------------------------------------------   I have done the exam and reviewed the above chart and it is accurate to the best of my knowledge. Development worker, community has been used in this note in any air is in the dictation or transcription are unintentional.  Wilhemena Durie, MD  [place]

## 2017-03-07 ENCOUNTER — Encounter: Payer: Self-pay | Admitting: Family Medicine

## 2017-03-26 ENCOUNTER — Other Ambulatory Visit: Payer: Self-pay

## 2017-03-26 ENCOUNTER — Other Ambulatory Visit: Payer: Self-pay | Admitting: Family Medicine

## 2017-03-26 DIAGNOSIS — E7800 Pure hypercholesterolemia, unspecified: Secondary | ICD-10-CM

## 2017-03-26 DIAGNOSIS — Z0000 Encounter for general adult medical examination without abnormal findings: Secondary | ICD-10-CM

## 2017-03-26 DIAGNOSIS — Z125 Encounter for screening for malignant neoplasm of prostate: Secondary | ICD-10-CM

## 2017-03-26 DIAGNOSIS — E039 Hypothyroidism, unspecified: Secondary | ICD-10-CM

## 2017-03-26 NOTE — Progress Notes (Signed)
Re ordered labs from 03/05/2017.

## 2017-03-27 LAB — COMPREHENSIVE METABOLIC PANEL
AG RATIO: 2.2 (calc) (ref 1.0–2.5)
ALKALINE PHOSPHATASE (APISO): 41 U/L (ref 40–115)
ALT: 23 U/L (ref 9–46)
AST: 20 U/L (ref 10–35)
Albumin: 4.1 g/dL (ref 3.6–5.1)
BILIRUBIN TOTAL: 0.5 mg/dL (ref 0.2–1.2)
BUN: 16 mg/dL (ref 7–25)
CHLORIDE: 104 mmol/L (ref 98–110)
CO2: 28 mmol/L (ref 20–32)
Calcium: 9.2 mg/dL (ref 8.6–10.3)
Creat: 0.99 mg/dL (ref 0.70–1.33)
GLOBULIN: 1.9 g/dL (ref 1.9–3.7)
Glucose, Bld: 94 mg/dL (ref 65–99)
Potassium: 4.4 mmol/L (ref 3.5–5.3)
Sodium: 139 mmol/L (ref 135–146)
Total Protein: 6 g/dL — ABNORMAL LOW (ref 6.1–8.1)

## 2017-03-27 LAB — CBC WITH DIFFERENTIAL/PLATELET
BASOS ABS: 101 {cells}/uL (ref 0–200)
BASOS PCT: 1.9 %
EOS PCT: 5.1 %
Eosinophils Absolute: 270 cells/uL (ref 15–500)
HEMATOCRIT: 45.3 % (ref 38.5–50.0)
HEMOGLOBIN: 15 g/dL (ref 13.2–17.1)
LYMPHS ABS: 2035 {cells}/uL (ref 850–3900)
MCH: 29.8 pg (ref 27.0–33.0)
MCHC: 33.1 g/dL (ref 32.0–36.0)
MCV: 90.1 fL (ref 80.0–100.0)
MPV: 9.4 fL (ref 7.5–12.5)
Monocytes Relative: 7.8 %
NEUTROS ABS: 2480 {cells}/uL (ref 1500–7800)
Neutrophils Relative %: 46.8 %
Platelets: 334 10*3/uL (ref 140–400)
RBC: 5.03 10*6/uL (ref 4.20–5.80)
RDW: 12 % (ref 11.0–15.0)
Total Lymphocyte: 38.4 %
WBC mixed population: 413 cells/uL (ref 200–950)
WBC: 5.3 10*3/uL (ref 3.8–10.8)

## 2017-03-27 LAB — PSA: PSA: 0.4 ng/mL (ref <=4.0)

## 2017-03-27 LAB — LIPID PANEL
CHOLESTEROL: 189 mg/dL (ref <200)
HDL: 51 mg/dL (ref >40)
LDL Cholesterol (Calc): 118 mg/dL (calc) — ABNORMAL HIGH
NON-HDL CHOLESTEROL (CALC): 138 mg/dL — AB (ref <130)
Total CHOL/HDL Ratio: 3.7 (calc) (ref <5.0)
Triglycerides: 95 mg/dL (ref <150)

## 2017-03-27 LAB — TSH: TSH: 2.44 mIU/L (ref 0.40–4.50)

## 2017-03-28 ENCOUNTER — Telehealth: Payer: Self-pay

## 2017-03-28 NOTE — Telephone Encounter (Signed)
-----   Message from Jerrol Banana., MD sent at 03/28/2017 12:41 PM EDT ----- Labs stable

## 2017-03-28 NOTE — Telephone Encounter (Signed)
[id number] 

## 2017-03-31 ENCOUNTER — Encounter: Payer: BLUE CROSS/BLUE SHIELD | Admitting: Family Medicine

## 2017-04-04 NOTE — Telephone Encounter (Signed)
Pt informed and voiced understanding of results. 

## 2017-08-06 ENCOUNTER — Other Ambulatory Visit: Payer: Self-pay

## 2017-08-06 DIAGNOSIS — E039 Hypothyroidism, unspecified: Secondary | ICD-10-CM

## 2017-08-06 MED ORDER — LEVOTHYROXINE SODIUM 100 MCG PO TABS: 100.0000 ug | ORAL_TABLET | Freq: Every day | ORAL | 3 refills | Status: DC

## 2017-08-15 ENCOUNTER — Other Ambulatory Visit: Payer: Self-pay | Admitting: Orthopaedic Surgery

## 2017-08-15 DIAGNOSIS — M25512 Pain in left shoulder: Secondary | ICD-10-CM

## 2017-08-15 DIAGNOSIS — M7542 Impingement syndrome of left shoulder: Secondary | ICD-10-CM

## 2017-08-15 DIAGNOSIS — M7502 Adhesive capsulitis of left shoulder: Secondary | ICD-10-CM

## 2017-08-26 ENCOUNTER — Ambulatory Visit
Admission: RE | Admit: 2017-08-26 | Discharge: 2017-08-26 | Disposition: A | Discharge status: Home / Self Care | Payer: BLUE CROSS/BLUE SHIELD | Source: Ambulatory Visit | Attending: Orthopaedic Surgery | Admitting: Orthopaedic Surgery

## 2017-08-26 DIAGNOSIS — M25512 Pain in left shoulder: Secondary | ICD-10-CM | POA: Diagnosis not present

## 2017-08-26 DIAGNOSIS — M7502 Adhesive capsulitis of left shoulder: Secondary | ICD-10-CM | POA: Diagnosis not present

## 2017-08-26 DIAGNOSIS — X58XXXA Exposure to other specified factors, initial encounter: Secondary | ICD-10-CM | POA: Insufficient documentation

## 2017-08-26 DIAGNOSIS — M7542 Impingement syndrome of left shoulder: Secondary | ICD-10-CM | POA: Insufficient documentation

## 2017-08-26 DIAGNOSIS — S43432A Superior glenoid labrum lesion of left shoulder, initial encounter: Secondary | ICD-10-CM | POA: Insufficient documentation

## 2017-09-04 HISTORY — PX: SHOULDER ARTHROSCOPY: SHX128

## 2018-03-10 ENCOUNTER — Ambulatory Visit (INDEPENDENT_AMBULATORY_CARE_PROVIDER_SITE_OTHER): Payer: BLUE CROSS/BLUE SHIELD | Admitting: Family Medicine

## 2018-03-10 ENCOUNTER — Encounter: Payer: Self-pay | Admitting: Family Medicine

## 2018-03-10 VITALS — BP 104/76 | HR 61 | Temp 98.0°F | Resp 16 | Ht 72.0 in | Wt 187.0 lb

## 2018-03-10 DIAGNOSIS — Z23 Encounter for immunization: Secondary | ICD-10-CM | POA: Diagnosis not present

## 2018-03-10 DIAGNOSIS — Z0000 Encounter for general adult medical examination without abnormal findings: Secondary | ICD-10-CM

## 2018-03-10 DIAGNOSIS — Z1211 Encounter for screening for malignant neoplasm of colon: Secondary | ICD-10-CM

## 2018-03-10 DIAGNOSIS — Z125 Encounter for screening for malignant neoplasm of prostate: Secondary | ICD-10-CM | POA: Diagnosis not present

## 2018-03-10 LAB — POCT URINALYSIS DIPSTICK
BILIRUBIN UA: NEGATIVE
Blood, UA: NEGATIVE
Glucose, UA: NEGATIVE
Ketones, UA: NEGATIVE
Leukocytes, UA: NEGATIVE
NITRITE UA: NEGATIVE
Protein, UA: NEGATIVE
Spec Grav, UA: 1.025 (ref 1.010–1.025)
Urobilinogen, UA: 0.2 E.U./dL
pH, UA: 5 (ref 5.0–8.0)

## 2018-03-10 LAB — IFOBT (OCCULT BLOOD): IMMUNOLOGICAL FECAL OCCULT BLOOD TEST: NEGATIVE

## 2018-03-10 NOTE — Progress Notes (Signed)
luariPatient: Charles Romero, Male    DOB: 24-Jun-1961, 57 y.o.   MRN: [id number] Visit Date: 03/10/2018  Today's Provider: Wilhemena Durie, MD   Chief Complaint  Patient presents with  . Annual Exam   Subjective:  Charles Romero is a 57 y.o. male who presents today for health maintenance and complete physical. He feels well. He reports exercising daily. He reports he is sleeping poorly. He is married and works in Insurance underwriter. His son works for a Teacher, adult education. His daughter is expecting her first child.  10/22/13 Colonoscopy, Elliott-tubular adenoma, repeat 5 years.  Review of Systems  Constitutional: Negative.   HENT: Negative.   Eyes: Negative.   Respiratory: Negative.   Cardiovascular: Negative.   Gastrointestinal: Negative.   Endocrine: Negative.   Genitourinary: Negative.   Musculoskeletal: Negative.   Skin: Negative.   Allergic/Immunologic: Negative.   Neurological: Negative.   Hematological: Negative.   Psychiatric/Behavioral: Negative.     Social History   Socioeconomic History  . Marital status: Married    Spouse name: Not on file  . Number of children: Not on file  . Years of education: Not on file  . Highest education level: Not on file  Occupational History  . Not on file  Social Needs  . Financial resource strain: Not on file  . Food insecurity:    Worry: Not on file    Inability: Not on file  . Transportation needs:    Medical: Not on file    Non-medical: Not on file  Tobacco Use  . Smoking status: Never Smoker  . Smokeless tobacco: Never Used  Substance and Sexual Activity  . Alcohol use: Yes    Comment: 3 drinks a week  . Drug use: No  . Sexual activity: Not on file  Lifestyle  . Physical activity:    Days per week: Not on file    Minutes per session: Not on file  . Stress: Not on file  Relationships  . Social connections:    Talks on phone: Not on file    Gets together: Not on file    Attends religious service: Not on file    Active  member of club or organization: Not on file    Attends meetings of clubs or organizations: Not on file    Relationship status: Not on file  . Intimate partner violence:    Fear of current or ex partner: Not on file    Emotionally abused: Not on file    Physically abused: Not on file    Forced sexual activity: Not on file  Other Topics Concern  . Not on file  Social History Narrative  . Not on file    Patient Active Problem List   Diagnosis Date Noted  . Allergic rhinitis 03/28/2015  . Enlarged lymph nodes 03/27/2015  . Hypercholesteremia 03/27/2015  . Adult hypothyroidism 03/27/2015  . Cannot sleep 03/27/2015  . Nasal obstruction 03/27/2015    Past Surgical History:  Procedure Laterality Date  . HEMORRHOID SURGERY    . HERNIA REPAIR    . ROTATOR CUFF [place]      His family history includes Arthritis in his mother; Colon polyps in his father; Glaucoma in his mother; Kidney cancer in his brother.     Outpatient Encounter Medications as of 03/10/2018  Medication Sig Note  . levothyroxine (SYNTHROID, LEVOTHROID) 100 MCG tablet Take 1 tablet (100 mcg total) by mouth daily.   . MULTIPLE VITAMIN PO  Take by mouth. 03/27/2015: Received from: [facility]  . OMEGA-3 FATTY ACIDS PO Take by mouth. 03/27/2015: Received from: [facility]  . zolpidem (AMBIEN) 10 MG tablet TAKE 1 TABLET BY MOUTH AT BEDTIME AS NEEDED FOR SLEEP    No facility-administered encounter medications on file as of 03/10/2018.     Patient Care Team: Jerrol Banana., MD as PCP - General (Family Medicine)      Objective:   Vitals:  Vitals:   03/10/18 0817  BP: 104/76  Pulse: 61  Resp: 16  Temp: 98 F (36.7 C)  TempSrc: Oral  Weight: 187 lb (84.8 kg)  Height: 6' (1.829 m)    Physical Exam  Constitutional: He is oriented to person, place, and time. He appears well-developed and well-nourished.  HENT:  Head: Normocephalic and atraumatic.  Right  Ear: External ear normal.  Left Ear: External ear normal.  Nose: Nose normal.  Mouth/Throat: Oropharynx is clear and moist.  Eyes: Pupils are equal, round, and reactive to light. Conjunctivae and EOM are normal.  Neck: Normal range of motion. Neck supple.  Cardiovascular: Normal rate, regular rhythm, normal heart sounds and intact distal pulses.  Pulmonary/Chest: Effort normal and breath sounds normal.  Abdominal: Soft. Bowel sounds are normal.  Genitourinary: Penis normal.  Musculoskeletal: Normal range of motion.  Dupeytrens contractures of both hands.  Neurological: He is alert and oriented to person, place, and time.  Skin: Skin is warm and dry.  Psychiatric: He has a normal mood and affect. His behavior is normal. Judgment and thought content normal.     Depression Screen PHQ 2/9 Scores 03/10/2018 03/05/2017 03/28/2015  PHQ - 2 Score 0 0 0  PHQ- 9 Score - 0 -      Assessment & Plan:     Routine Health Maintenance and Physical Exam  Exercise Activities and Dietary recommendations Goals   None     Immunization History  Administered Date(s) Administered  . Hepatitis A 12/25/2004, 03/03/2013  . Influenza,inj,Quad PF,6+ Mos 03/28/2015, 03/28/2016, 03/05/2017  . Td 10/04/2004  . Tdap 03/03/2013    Health Maintenance  Topic Date Due  . HIV Screening  09/09/1974  . INFLUENZA VACCINE  01/29/2018  . COLONOSCOPY  10/23/2018  . TETANUS/TDAP  03/04/2023  . Hepatitis C Screening  Completed    Colonoscopy 2020. Discussed health benefits of physical activity, and encouraged him to engage in regular exercise appropriate for his age and condition.  Left Shoulder surgery 09/04/17. Dupeytrens Contractures both hands    I have done the exam and reviewed the chart and it is accurate to the best of my knowledge. Development worker, community has been used and  any errors in dictation or transcription are unintentional. Charles Romero M.D. [facility]

## 2018-03-11 LAB — LIPID PANEL WITH LDL/HDL RATIO
CHOLESTEROL TOTAL: 207 mg/dL — AB (ref 100–199)
HDL: 57 mg/dL (ref >39)
LDL Calculated: 128 mg/dL — ABNORMAL HIGH (ref 0–99)
LDl/HDL Ratio: 2.2 ratio (ref 0.0–3.6)
TRIGLYCERIDES: 108 mg/dL (ref 0–149)
VLDL CHOLESTEROL CAL: 22 mg/dL (ref 5–40)

## 2018-03-11 LAB — COMPREHENSIVE METABOLIC PANEL
A/G RATIO: 2.4 — AB (ref 1.2–2.2)
ALT: 24 IU/L (ref 0–44)
AST: 19 IU/L (ref 0–40)
Albumin: 4.7 g/dL (ref 3.5–5.5)
Alkaline Phosphatase: 50 IU/L (ref 39–117)
BILIRUBIN TOTAL: 0.6 mg/dL (ref 0.0–1.2)
BUN/Creatinine Ratio: 14 (ref 9–20)
BUN: 14 mg/dL (ref 6–24)
CHLORIDE: 100 mmol/L (ref 96–106)
CO2: 23 mmol/L (ref 20–29)
Calcium: 9.4 mg/dL (ref 8.7–10.2)
Creatinine, Ser: 0.97 mg/dL (ref 0.76–1.27)
GFR calc Af Amer: 99 mL/min/{1.73_m2} (ref >59)
GFR calc non Af Amer: 86 mL/min/{1.73_m2} (ref >59)
GLUCOSE: 92 mg/dL (ref 65–99)
Globulin, Total: 2 g/dL (ref 1.5–4.5)
POTASSIUM: 4.1 mmol/L (ref 3.5–5.2)
Sodium: 140 mmol/L (ref 134–144)
TOTAL PROTEIN: 6.7 g/dL (ref 6.0–8.5)

## 2018-03-11 LAB — TSH: TSH: 3.69 u[IU]/mL (ref 0.450–4.500)

## 2018-03-11 LAB — CBC WITH DIFFERENTIAL/PLATELET
BASOS ABS: 0.1 10*3/uL (ref 0.0–0.2)
BASOS: 2 %
EOS (ABSOLUTE): 0.6 10*3/uL — AB (ref 0.0–0.4)
Eos: 11 %
Hematocrit: 46.3 % (ref 37.5–51.0)
Hemoglobin: 15.5 g/dL (ref 13.0–17.7)
IMMATURE GRANS (ABS): 0 10*3/uL (ref 0.0–0.1)
Immature Granulocytes: 0 %
LYMPHS: 32 %
Lymphocytes Absolute: 1.9 10*3/uL (ref 0.7–3.1)
MCH: 30.9 pg (ref 26.6–33.0)
MCHC: 33.5 g/dL (ref 31.5–35.7)
MCV: 92 fL (ref 79–97)
MONOCYTES: 7 %
Monocytes Absolute: 0.4 10*3/uL (ref 0.1–0.9)
NEUTROS ABS: 2.7 10*3/uL (ref 1.4–7.0)
Neutrophils: 48 %
PLATELETS: 328 10*3/uL (ref 150–450)
RBC: 5.01 x10E6/uL (ref 4.14–5.80)
RDW: 13.2 % (ref 12.3–15.4)
WBC: 5.7 10*3/uL (ref 3.4–10.8)

## 2018-03-11 LAB — PSA: Prostate Specific Ag, Serum: 0.4 ng/mL (ref 0.0–4.0)

## 2018-03-16 ENCOUNTER — Telehealth: Payer: Self-pay | Admitting: Family Medicine

## 2018-03-16 NOTE — Telephone Encounter (Signed)
Pt stated at his Physical appt on 03/10/18 he left a form that he needs completed for his work/insurance. Pt is requesting call back for status update. Please advise. Thanks TNP

## 2018-03-16 NOTE — Telephone Encounter (Signed)
Advised patient that form was filled out. Left up front for patient to pick up.

## 2018-08-20 ENCOUNTER — Other Ambulatory Visit: Payer: Self-pay | Admitting: Family Medicine

## 2018-08-20 DIAGNOSIS — E039 Hypothyroidism, unspecified: Secondary | ICD-10-CM

## 2018-09-15 IMAGING — MR MR SHOULDER*L* W/O CM
5 series · 40 of 40 positions shown · non-contrast
Comparison: None.

CLINICAL DATA: Status post fall in [REDACTED]. Shoulder pain.
Limited range of motion.

EXAM:
MRI OF THE LEFT SHOULDER WITHOUT CONTRAST
TECHNIQUE: Multiplanar, multisequence MR imaging of the shoulder was performed.
No intravenous contrast was administered.

[Series 3: T2 fat-sat · axial · 4.0mm · 0.47mm/px · z∈[-28,+78]mm · 8 of 25 slices shown (1 of 3)]
[im 1/25]
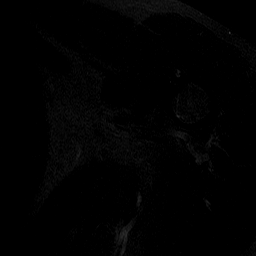
[im 4/25]
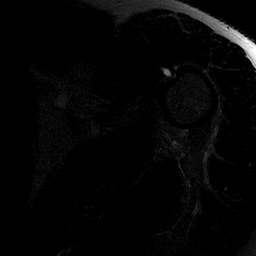
[im 7/25]
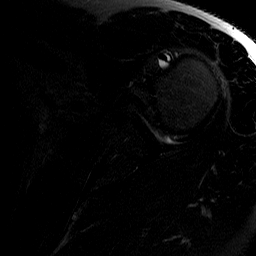
[im 11/25]
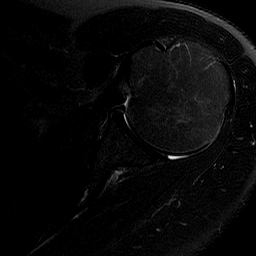
[im 14/25]
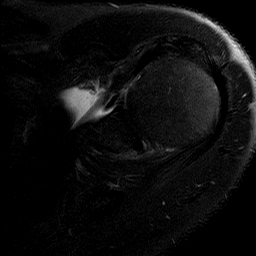
[im 18/25]
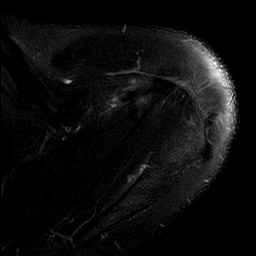
[im 21/25]
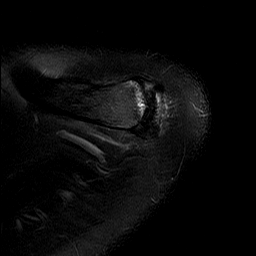
[im 25/25]
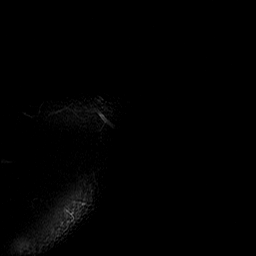

[Series 4: T2 fat-sat · oblique · 4.0mm · 0.62mm/px · 8 of 23 slices shown (2 of 3)]
[im 1/23]
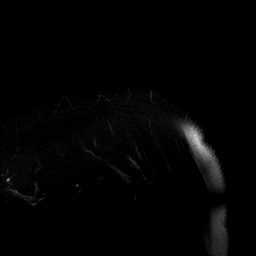
[im 4/23]
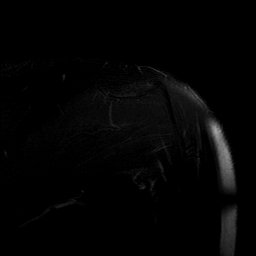
[im 7/23]
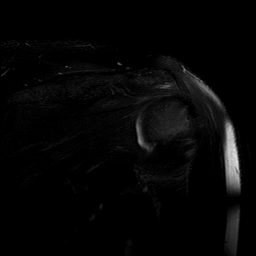
[im 10/23]
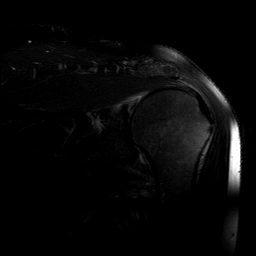
[im 13/23]
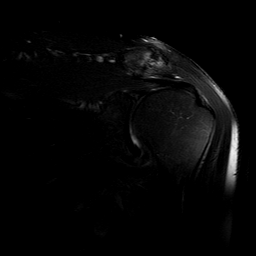
[im 16/23]
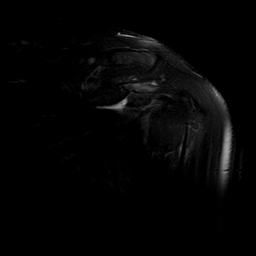
[im 19/23]
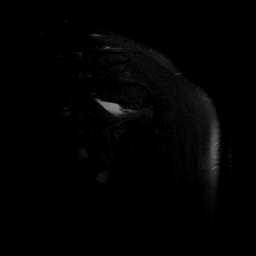
[im 23/23]
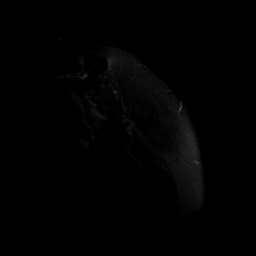

[Series 5: PD · oblique · 4.0mm · 0.62mm/px · 8 of 23 slices shown]
[im 1/23]
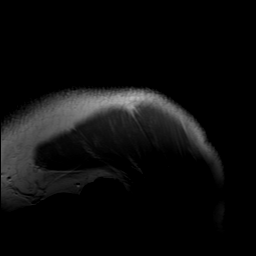
[im 4/23]
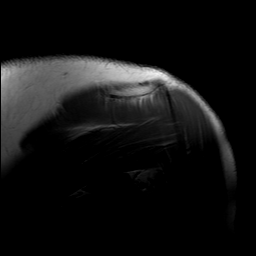
[im 7/23]
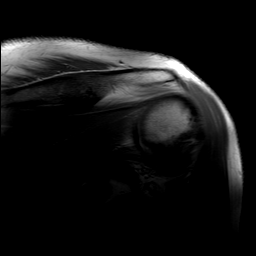
[im 10/23]
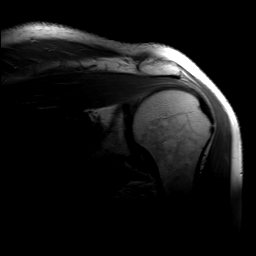
[im 13/23]
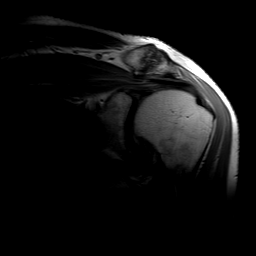
[im 16/23]
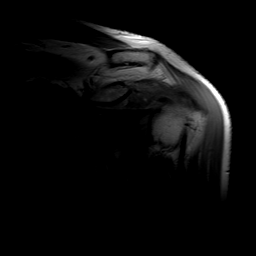
[im 19/23]
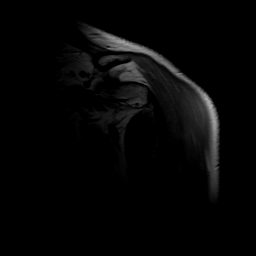
[im 23/23]
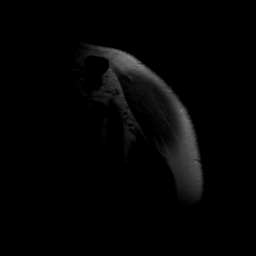

[Series 6: T1 · oblique · 4.0mm · 0.62mm/px · 8 of 23 slices shown]
[im 1/23]
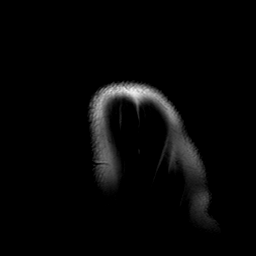
[im 4/23]
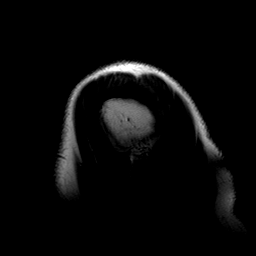
[im 7/23]
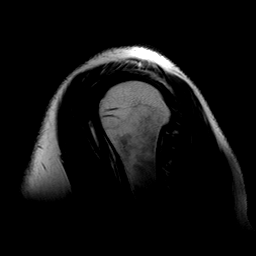
[im 10/23]
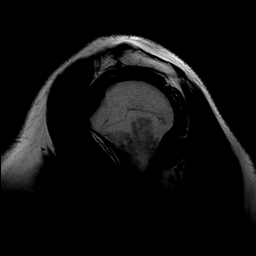
[im 13/23]
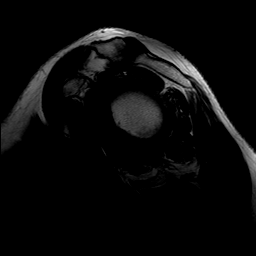
[im 16/23]
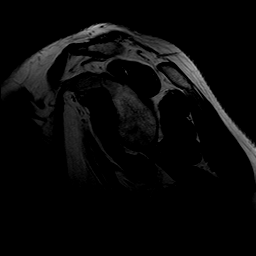
[im 19/23]
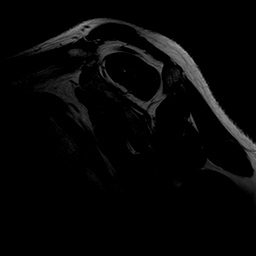
[im 23/23]
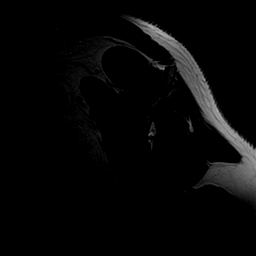

[Series 7: T2 fat-sat · oblique · 4.0mm · 0.62mm/px · 8 of 23 slices shown (3 of 3)]
[im 1/23]
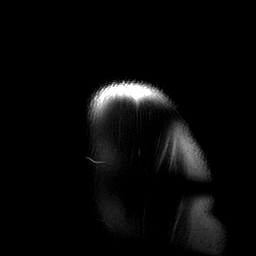
[im 4/23]
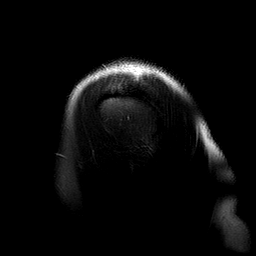
[im 7/23]
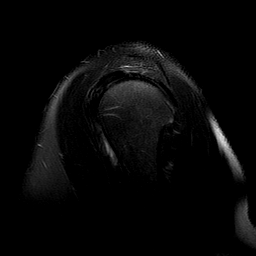
[im 10/23]
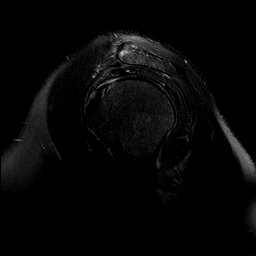
[im 13/23]
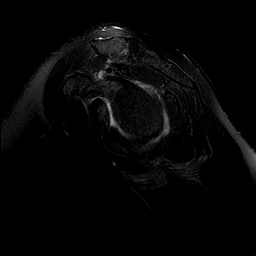
[im 16/23]
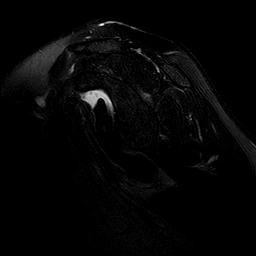
[im 19/23]
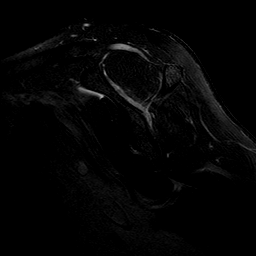
[im 23/23]
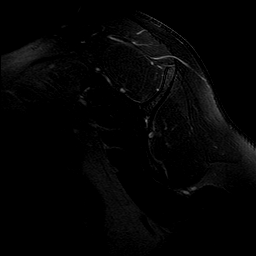

[40 of 40 positions shown; findings below may reference images not displayed]

FINDINGS: Rotator cuff: Mild tendinosis of the supraspinatus tendon with
fraying along the bursal surface. Infraspinatus tendon is intact.
Teres minor tendon is intact. Subscapularis tendon is intact.

Muscles: No atrophy or fatty replacement of nor abnormal signal
within, the muscles of the rotator cuff.

Biceps long head: Mild tendinosis of the intra-articular portion of
the long head of the biceps tendon.

Acromioclavicular Joint: Moderate arthropathy of the
acromioclavicular joint. Type I acromion. No subacromial/subdeltoid
bursal fluid.

Glenohumeral Joint: No joint effusion. No focal chondral defect.
Thickening of inferior joint capsule as can be seen with adhesive
capsulitis.

Labrum:  Superior labral tear from anterior posterior.

Bones:  No acute osseous abnormality.  No aggressive osseous lesion.

Other: No fluid collection or hematoma.
IMPRESSION: 1. Mild tendinosis of the supraspinatus tendon with fraying along
the bursal surface.
2. Superior labral tear from anterior posterior.
3. Mild tendinosis of the intra-articular portion of the long head
of the biceps tendon.
4. Thickening of inferior joint capsule as can be seen with adhesive
capsulitis.

## 2018-12-17 ENCOUNTER — Other Ambulatory Visit: Payer: Self-pay | Admitting: Family Medicine

## 2018-12-17 DIAGNOSIS — E039 Hypothyroidism, unspecified: Secondary | ICD-10-CM

## 2019-03-15 ENCOUNTER — Telehealth: Payer: Self-pay

## 2019-03-15 NOTE — Telephone Encounter (Signed)
LMTCB Covid screening

## 2019-03-16 ENCOUNTER — Other Ambulatory Visit: Payer: Self-pay

## 2019-03-16 ENCOUNTER — Encounter: Payer: Self-pay | Admitting: Family Medicine

## 2019-03-16 ENCOUNTER — Ambulatory Visit (INDEPENDENT_AMBULATORY_CARE_PROVIDER_SITE_OTHER): Payer: BC Managed Care – PPO | Admitting: Family Medicine

## 2019-03-16 VITALS — BP 118/62 | HR 52 | Temp 98.4°F | Resp 16 | Ht 72.5 in | Wt 182.0 lb

## 2019-03-16 DIAGNOSIS — Z0000 Encounter for general adult medical examination without abnormal findings: Secondary | ICD-10-CM | POA: Diagnosis not present

## 2019-03-16 DIAGNOSIS — Z23 Encounter for immunization: Secondary | ICD-10-CM

## 2019-03-16 DIAGNOSIS — Z1211 Encounter for screening for malignant neoplasm of colon: Secondary | ICD-10-CM | POA: Diagnosis not present

## 2019-03-16 DIAGNOSIS — Z125 Encounter for screening for malignant neoplasm of prostate: Secondary | ICD-10-CM

## 2019-03-16 LAB — POCT URINALYSIS DIPSTICK
Bilirubin, UA: NEGATIVE
Blood, UA: NEGATIVE
Glucose, UA: NEGATIVE
Ketones, UA: NEGATIVE
Leukocytes, UA: NEGATIVE
Nitrite, UA: NEGATIVE
Protein, UA: NEGATIVE
Spec Grav, UA: 1.025 (ref 1.010–1.025)
Urobilinogen, UA: 0.2 E.U./dL
pH, UA: 6 (ref 5.0–8.0)

## 2019-03-16 NOTE — Progress Notes (Signed)
Patient: Charles Romero, Male    DOB: [birth year], 59 y.o.   MRN: [id number] Visit Date: 03/16/2019  Today's Provider: Wilhemena Durie, MD   Chief Complaint  Patient presents with  . Annual Exam   Subjective:     Annual physical exam Charles Romero is a 58 y.o. male who presents today for health maintenance and complete physical. He feels well. He reports exercising daily. He reports he is sleeping fairly well. He is now a grandfather.  Colonoscopy- 10/22/2013. Internal hemorrhoids. Tubular adenoma. Repeat 09/2018.  Immunization History  Administered Date(s) Administered  . Hepatitis A 12/25/2004, 03/03/2013  . Influenza Split 03/26/2011  . Influenza,inj,Quad PF,6+ Mos 03/23/2014, 03/28/2015, 03/28/2016, 03/05/2017, 03/10/2018  . Td 10/04/2004  . Tdap 03/03/2013      Review of Systems  Constitutional: Negative.   HENT: Negative.   Eyes: Negative.   Respiratory: Negative.   Cardiovascular: Negative.   Gastrointestinal: Negative.   Endocrine: Negative.   Genitourinary: Negative.   Musculoskeletal: Negative.   Skin: Negative.   Allergic/Immunologic: Negative.   Neurological: Negative.   Hematological: Negative.   Psychiatric/Behavioral: Negative.     Social History      He  reports that he has never smoked. He has never used smokeless tobacco. He reports current alcohol use. He reports that he does not use drugs.       Social History   Socioeconomic History  . Marital status: Married    Spouse name: Not on file  . Number of children: Not on file  . Years of education: Not on file  . Highest education level: Not on file  Occupational History  . Not on file  Social Needs  . Financial resource strain: Not on file  . Food insecurity    Worry: Not on file    Inability: Not on file  . Transportation needs    Medical: Not on file    Non-medical: Not on file  Tobacco Use  . Smoking status: Never Smoker  . Smokeless tobacco: Never Used  Substance  and Sexual Activity  . Alcohol use: Yes    Comment: 3 drinks a week  . Drug use: No  . Sexual activity: Not on file  Lifestyle  . Physical activity    Days per week: Not on file    Minutes per session: Not on file  . Stress: Not on file  Relationships  . Social Herbalist on phone: Not on file    Gets together: Not on file    Attends religious service: Not on file    Active member of club or organization: Not on file    Attends meetings of clubs or organizations: Not on file    Relationship status: Not on file  Other Topics Concern  . Not on file  Social History Narrative  . Not on file    No past medical history on file.   Patient Active Problem List   Diagnosis Date Noted  . Allergic rhinitis 03/28/2015  . Enlarged lymph nodes 03/27/2015  . Hypercholesteremia 03/27/2015  . Adult hypothyroidism 03/27/2015  . Cannot sleep 03/27/2015  . Nasal obstruction 03/27/2015    Past Surgical History:  Procedure Laterality Date  . HEMORRHOID SURGERY    . HERNIA REPAIR    . ROTATOR CUFF REPAIR    . SHOULDER ARTHROSCOPY Left 09/04/2017   rotator cuff repair, torn labrium, bicep and rotator  . VASECTOMY  Family History        Family Status  Relation Name Status  . Mother  Deceased  . Father  Alive  . Brother  Alive  . Brother  Alive        His family history includes Arthritis in his mother; Colon polyps in his father; Glaucoma in his mother; Kidney cancer in his brother.      No Known Allergies   Current Outpatient Medications:  .  levothyroxine (SYNTHROID) 100 MCG tablet, TAKE ONE TABLET EVERY DAY, Disp: 90 tablet, Rfl: 3 .  MULTIPLE VITAMIN PO, Take by mouth., Disp: , Rfl:  .  OMEGA-3 FATTY ACIDS PO, Take by mouth., Disp: , Rfl:  .  zolpidem (AMBIEN) 10 MG tablet, TAKE 1 TABLET BY MOUTH AT BEDTIME AS NEEDED FOR SLEEP, Disp: 30 tablet, Rfl: 5   Patient Care Team: Jerrol Banana., MD as PCP - General (Family Medicine)    Objective:     Vitals: BP 118/62   Pulse (!) 52   Temp 98.4 F (36.9 C)   Resp 16   Ht 6' 0.5" (1.842 m)   Wt 182 lb (82.6 kg)   SpO2 98%   BMI 24.34 kg/m    Vitals:   03/16/19 0918  BP: 118/62  Pulse: (!) 52  Resp: 16  Temp: 98.4 F (36.9 C)  SpO2: 98%  Weight: 182 lb (82.6 kg)  Height: 6' 0.5" (1.842 m)     Physical Exam Vitals signs reviewed.  Constitutional:      Appearance: He is well-developed.  HENT:     Head: Normocephalic and atraumatic.     Right Ear: External ear normal.     Left Ear: External ear normal.     Nose: Nose normal.  Eyes:     Conjunctiva/sclera: Conjunctivae normal.     Pupils: Pupils are equal, round, and reactive to light.  Neck:     Musculoskeletal: Normal range of motion and neck supple.  Cardiovascular:     Rate and Rhythm: Normal rate and regular rhythm.     Heart sounds: Normal heart sounds.  Pulmonary:     Effort: Pulmonary effort is normal.     Breath sounds: Normal breath sounds.  Abdominal:     General: Bowel sounds are normal.     Palpations: Abdomen is soft.  Genitourinary:    Penis: Normal.   Musculoskeletal: Normal range of motion.     Comments: Dupeytrens contractures of both hands.  Skin:    General: Skin is warm and dry.  Neurological:     Mental Status: He is alert and oriented to person, place, and time.  Psychiatric:        Behavior: Behavior normal.        Thought Content: Thought content normal.        Judgment: Judgment normal.      Depression Screen PHQ 2/9 Scores 03/16/2019 03/10/2018 03/05/2017 03/28/2015  PHQ - 2 Score 0 0 0 0  PHQ- 9 Score - - 0 -       Assessment & Plan:     Routine Health Maintenance and Physical Exam  Exercise Activities and Dietary recommendations Goals   None     Immunization History  Administered Date(s) Administered  . Hepatitis A 12/25/2004, 03/03/2013  . Influenza Split 03/26/2011  . Influenza,inj,Quad PF,6+ Mos 03/23/2014, 03/28/2015, 03/28/2016, 03/05/2017, 03/10/2018   . Td 10/04/2004  . Tdap 03/03/2013    Health Maintenance  Topic Date Due  . HIV  Screening  09/09/1974  . COLONOSCOPY  10/23/2018  . INFLUENZA VACCINE  01/30/2019  . TETANUS/TDAP  03/04/2023  . Hepatitis C Screening  Completed     Discussed health benefits of physical activity, and encouraged him to engage in regular exercise appropriate for his age and condition.  1. Annual physical exam RTC 1 year. - CBC with Differential/Platelet - Comprehensive metabolic panel - Lipid panel - TSH - POCT urinalysis dipstick  2. Need for influenza vaccination  - Flu Vaccine QUAD 6+ mos PF IM (Fluarix Quad PF)  3. Colon cancer screening  - Ambulatory referral to Gastroenterology  4. Prostate cancer screening  - PSA  I, Rachelle L. Presley, CMA, am acting as a Education administrator for [employer]. Rosanna Randy, [place]    Wilhemena Durie, MD  [facility]

## 2019-03-17 LAB — CBC WITH DIFFERENTIAL/PLATELET
Basophils Absolute: 0.1 10*3/uL (ref 0.0–0.2)
Basos: 2 %
EOS (ABSOLUTE): 0.3 10*3/uL (ref 0.0–0.4)
Eos: 6 %
Hematocrit: 47.7 % (ref 37.5–51.0)
Hemoglobin: 15.5 g/dL (ref 13.0–17.7)
Immature Grans (Abs): 0 10*3/uL (ref 0.0–0.1)
Immature Granulocytes: 1 %
Lymphocytes Absolute: 1.8 10*3/uL (ref 0.7–3.1)
Lymphs: 29 %
MCH: 30 pg (ref 26.6–33.0)
MCHC: 32.5 g/dL (ref 31.5–35.7)
MCV: 92 fL (ref 79–97)
Monocytes Absolute: 0.5 10*3/uL (ref 0.1–0.9)
Monocytes: 8 %
Neutrophils Absolute: 3.4 10*3/uL (ref 1.4–7.0)
Neutrophils: 54 %
Platelets: 344 10*3/uL (ref 150–450)
RBC: 5.16 x10E6/uL (ref 4.14–5.80)
RDW: 12.1 % (ref 11.6–15.4)
WBC: 6.1 10*3/uL (ref 3.4–10.8)

## 2019-03-17 LAB — LIPID PANEL
Chol/HDL Ratio: 2.9 ratio (ref 0.0–5.0)
Cholesterol, Total: 194 mg/dL (ref 100–199)
HDL: 66 mg/dL (ref >39)
LDL Chol Calc (NIH): 110 mg/dL — ABNORMAL HIGH (ref 0–99)
Triglycerides: 100 mg/dL (ref 0–149)
VLDL Cholesterol Cal: 18 mg/dL (ref 5–40)

## 2019-03-17 LAB — COMPREHENSIVE METABOLIC PANEL
ALT: 27 IU/L (ref 0–44)
AST: 21 IU/L (ref 0–40)
Albumin/Globulin Ratio: 2.6 — ABNORMAL HIGH (ref 1.2–2.2)
Albumin: 4.7 g/dL (ref 3.8–4.9)
Alkaline Phosphatase: 48 IU/L (ref 39–117)
BUN/Creatinine Ratio: 20 (ref 9–20)
BUN: 16 mg/dL (ref 6–24)
Bilirubin Total: 0.9 mg/dL (ref 0.0–1.2)
CO2: 26 mmol/L (ref 20–29)
Calcium: 9.5 mg/dL (ref 8.7–10.2)
Chloride: 101 mmol/L (ref 96–106)
Creatinine, Ser: 0.81 mg/dL (ref 0.76–1.27)
GFR calc Af Amer: 112 mL/min/{1.73_m2} (ref >59)
GFR calc non Af Amer: 97 mL/min/{1.73_m2} (ref >59)
Globulin, Total: 1.8 g/dL (ref 1.5–4.5)
Glucose: 88 mg/dL (ref 65–99)
Potassium: 4.5 mmol/L (ref 3.5–5.2)
Sodium: 139 mmol/L (ref 134–144)
Total Protein: 6.5 g/dL (ref 6.0–8.5)

## 2019-03-17 LAB — TSH: TSH: 2.64 u[IU]/mL (ref 0.450–4.500)

## 2019-03-17 LAB — PSA: Prostate Specific Ag, Serum: 0.9 ng/mL (ref 0.0–4.0)

## 2019-03-19 ENCOUNTER — Telehealth: Payer: Self-pay

## 2019-03-19 NOTE — Telephone Encounter (Signed)
Patient was advised of his labs.  He was concerned that his PSA doubled, although he realizes that it is still below 1.0.  After talking to him and sensing his concern I told him I would let you know but that I did not think this was something that needed a referral at this time.  It was questioned if he should maybe have it checked in 6 months.  I told him I would ask if you thought that was necessary.   He also asked if the form he left was ready to be picked up.   Thanks

## 2019-03-19 NOTE — Telephone Encounter (Signed)
-----   Message from Jerrol Banana., MD sent at 03/18/2019  9:31 AM EDT ----- Labs good.

## 2019-03-19 NOTE — Telephone Encounter (Signed)
It is fine to see him back and recheck in 6 months but at below 1 it is almost certainly benign.

## 2019-03-23 NOTE — Telephone Encounter (Signed)
Appointment scheduled for follow up 

## 2019-03-30 DIAGNOSIS — M720 Palmar fascial fibromatosis [Dupuytren]: Secondary | ICD-10-CM | POA: Diagnosis not present

## 2019-04-07 ENCOUNTER — Other Ambulatory Visit: Payer: Self-pay

## 2019-04-07 ENCOUNTER — Ambulatory Visit: Payer: BC Managed Care – PPO | Admitting: Family Medicine

## 2019-04-07 DIAGNOSIS — Z20828 Contact with and (suspected) exposure to other viral communicable diseases: Secondary | ICD-10-CM | POA: Diagnosis not present

## 2019-04-07 DIAGNOSIS — R05 Cough: Secondary | ICD-10-CM

## 2019-04-07 DIAGNOSIS — R059 Cough, unspecified: Secondary | ICD-10-CM

## 2019-04-07 DIAGNOSIS — Z20822 Contact with and (suspected) exposure to covid-19: Secondary | ICD-10-CM

## 2019-04-07 NOTE — Progress Notes (Signed)
Order for covid placed per Dr. Rosanna Randy. Patient has cough, congestion, fatigue. [place] and now feeling ill.

## 2019-04-08 ENCOUNTER — Ambulatory Visit: Payer: BC Managed Care – PPO | Admitting: Family Medicine

## 2019-04-08 LAB — NOVEL CORONAVIRUS, NAA: SARS-CoV-2, NAA: NOT DETECTED

## 2019-04-14 ENCOUNTER — Encounter: Payer: Self-pay | Admitting: *Deleted

## 2019-04-20 ENCOUNTER — Telehealth: Payer: Self-pay

## 2019-04-20 ENCOUNTER — Other Ambulatory Visit: Payer: Self-pay

## 2019-04-20 DIAGNOSIS — Z86010 Personal history of colonic polyps: Secondary | ICD-10-CM

## 2019-04-20 DIAGNOSIS — Z1211 Encounter for screening for malignant neoplasm of colon: Secondary | ICD-10-CM

## 2019-04-20 NOTE — Telephone Encounter (Signed)
Gastroenterology Pre-Procedure Review  Request Date: 05/19/19 Requesting Physician: Dr. Marius Ditch  PATIENT REVIEW QUESTIONS: The patient responded to the following health history questions as indicated:    1. Are you having any GI issues? no 2. Do you have a personal history of Polyps? yes (5 years ago) 3. Do you have a family history of Colon Cancer or Polyps? no 4. Diabetes Mellitus? no 5. Joint replacements in the past 12 months?no 6. Major health problems in the past 3 months?no 7. Any artificial heart valves, MVP, or defibrillator?no    MEDICATIONS & ALLERGIES:    Patient reports the following regarding taking any anticoagulation/antiplatelet therapy:   Plavix, Coumadin, Eliquis, Xarelto, Lovenox, Pradaxa, Brilinta, or Effient? no Aspirin? no  Patient confirms/reports the following medications:  Current Outpatient Medications  Medication Sig Dispense Refill  . levothyroxine (SYNTHROID) 100 MCG tablet TAKE ONE TABLET EVERY DAY 90 tablet 3  . MULTIPLE VITAMIN PO Take by mouth.    . OMEGA-3 FATTY ACIDS PO Take by mouth.    . zolpidem (AMBIEN) 10 MG tablet TAKE 1 TABLET BY MOUTH AT BEDTIME AS NEEDED FOR SLEEP 30 tablet 5   No current facility-administered medications for this visit.     Patient confirms/reports the following allergies:  No Known Allergies  No orders of the defined types were placed in this encounter.   AUTHORIZATION INFORMATION Primary Insurance: 1D#: Group #:  Secondary Insurance: 1D#: Group #:  SCHEDULE INFORMATION: Date: [place] 05/19/19 Time: Location:

## 2019-04-21 ENCOUNTER — Other Ambulatory Visit: Payer: Self-pay | Admitting: Family Medicine

## 2019-04-21 DIAGNOSIS — E039 Hypothyroidism, unspecified: Secondary | ICD-10-CM

## 2019-05-14 ENCOUNTER — Other Ambulatory Visit: Payer: Self-pay

## 2019-05-14 ENCOUNTER — Other Ambulatory Visit
Admission: RE | Admit: 2019-05-14 | Discharge: 2019-05-14 | Disposition: A | Discharge status: Home / Self Care | Payer: BC Managed Care – PPO | Source: Ambulatory Visit | Attending: Gastroenterology | Admitting: Gastroenterology

## 2019-05-14 DIAGNOSIS — Z01812 Encounter for preprocedural laboratory examination: Secondary | ICD-10-CM | POA: Diagnosis not present

## 2019-05-14 DIAGNOSIS — Z20828 Contact with and (suspected) exposure to other viral communicable diseases: Secondary | ICD-10-CM | POA: Diagnosis not present

## 2019-05-15 LAB — SARS CORONAVIRUS 2 (TAT 6-24 HRS): SARS Coronavirus 2: NEGATIVE

## 2019-05-17 DIAGNOSIS — D225 Melanocytic nevi of trunk: Secondary | ICD-10-CM | POA: Diagnosis not present

## 2019-05-17 DIAGNOSIS — D2230 Melanocytic nevi of unspecified part of face: Secondary | ICD-10-CM | POA: Diagnosis not present

## 2019-05-17 DIAGNOSIS — D2261 Melanocytic nevi of right upper limb, including shoulder: Secondary | ICD-10-CM | POA: Diagnosis not present

## 2019-05-17 DIAGNOSIS — D485 Neoplasm of uncertain behavior of skin: Secondary | ICD-10-CM | POA: Diagnosis not present

## 2019-05-17 DIAGNOSIS — D229 Melanocytic nevi, unspecified: Secondary | ICD-10-CM | POA: Diagnosis not present

## 2019-05-17 DIAGNOSIS — Z1283 Encounter for screening for malignant neoplasm of skin: Secondary | ICD-10-CM | POA: Diagnosis not present

## 2019-05-18 ENCOUNTER — Encounter: Payer: Self-pay | Admitting: *Deleted

## 2019-05-19 ENCOUNTER — Encounter
Admission: RE | Disposition: A | Discharge status: Home / Self Care | Payer: Self-pay | Source: Home / Self Care | Attending: Gastroenterology

## 2019-05-19 ENCOUNTER — Ambulatory Visit
Admission: RE | Admit: 2019-05-19 | Discharge: 2019-05-19 | Disposition: A | Discharge status: Home / Self Care | Payer: BC Managed Care – PPO | Attending: Gastroenterology | Admitting: Gastroenterology

## 2019-05-19 ENCOUNTER — Other Ambulatory Visit: Payer: Self-pay

## 2019-05-19 ENCOUNTER — Ambulatory Visit: Payer: BC Managed Care – PPO | Admitting: Anesthesiology

## 2019-05-19 ENCOUNTER — Encounter: Payer: Self-pay | Admitting: *Deleted

## 2019-05-19 DIAGNOSIS — K635 Polyp of colon: Secondary | ICD-10-CM

## 2019-05-19 DIAGNOSIS — K5790 Diverticulosis of intestine, part unspecified, without perforation or abscess without bleeding: Secondary | ICD-10-CM | POA: Diagnosis not present

## 2019-05-19 DIAGNOSIS — Z8371 Family history of colonic polyps: Secondary | ICD-10-CM | POA: Diagnosis not present

## 2019-05-19 DIAGNOSIS — Z79899 Other long term (current) drug therapy: Secondary | ICD-10-CM | POA: Diagnosis not present

## 2019-05-19 DIAGNOSIS — Z8051 Family history of malignant neoplasm of kidney: Secondary | ICD-10-CM | POA: Insufficient documentation

## 2019-05-19 DIAGNOSIS — Z8261 Family history of arthritis: Secondary | ICD-10-CM | POA: Diagnosis not present

## 2019-05-19 DIAGNOSIS — E039 Hypothyroidism, unspecified: Secondary | ICD-10-CM | POA: Diagnosis not present

## 2019-05-19 DIAGNOSIS — Z86010 Personal history of colonic polyps: Secondary | ICD-10-CM | POA: Diagnosis not present

## 2019-05-19 DIAGNOSIS — D124 Benign neoplasm of descending colon: Secondary | ICD-10-CM | POA: Diagnosis not present

## 2019-05-19 DIAGNOSIS — Z1211 Encounter for screening for malignant neoplasm of colon: Secondary | ICD-10-CM | POA: Diagnosis not present

## 2019-05-19 DIAGNOSIS — D123 Benign neoplasm of transverse colon: Secondary | ICD-10-CM | POA: Insufficient documentation

## 2019-05-19 DIAGNOSIS — Z820 Family history of epilepsy and other diseases of the nervous system: Secondary | ICD-10-CM | POA: Diagnosis not present

## 2019-05-19 DIAGNOSIS — K648 Other hemorrhoids: Secondary | ICD-10-CM | POA: Diagnosis not present

## 2019-05-19 DIAGNOSIS — K640 First degree hemorrhoids: Secondary | ICD-10-CM | POA: Diagnosis not present

## 2019-05-19 HISTORY — PX: COLONOSCOPY WITH PROPOFOL: SHX5780

## 2019-05-19 HISTORY — DX: Hypothyroidism, unspecified: E03.9

## 2019-05-19 SURGERY — COLONOSCOPY WITH PROPOFOL
Anesthesia: General

## 2019-05-19 MED ORDER — PROPOFOL 500 MG/50ML IV EMUL
INTRAVENOUS | Status: DC | PRN
  Administered 2019-05-19: 140 ug/kg/min via INTRAVENOUS

## 2019-05-19 MED ORDER — PROPOFOL 10 MG/ML IV BOLUS
INTRAVENOUS | Status: DC | PRN
  Administered 2019-05-19: 100 mg via INTRAVENOUS

## 2019-05-19 MED ORDER — SODIUM CHLORIDE 0.9 % IV SOLN
INTRAVENOUS | Status: DC
  Administered 2019-05-19: 13:00:00 via INTRAVENOUS

## 2019-05-19 NOTE — Anesthesia Post-op Follow-up Note (Signed)
Anesthesia QCDR form completed.        

## 2019-05-19 NOTE — Op Note (Signed)
[facility] Gastroenterology Patient Name: Charles Romero Procedure Date: 05/19/2019 1:03 PM MRN: [id number] Account #: [id number] Date of Birth: 10-28-1960 Admit Type: Outpatient Age: 58 Room: [facility] ENDO ROOM 1 Gender: Male Note Status: Finalized Procedure:             Colonoscopy Indications:           High risk colon cancer surveillance: Personal history                         of colonic polyps Providers:             Jonathon Bellows MD, MD Medicines:             Monitored Anesthesia Care Complications:         No immediate complications. Procedure:             Pre-Anesthesia Assessment:                        - Prior to the procedure, a History and Physical was                         performed, and patient medications, allergies and                         sensitivities were reviewed. The patient's tolerance                         of previous anesthesia was reviewed.                        - The risks and benefits of the procedure and the                         sedation options and risks were discussed with the                         patient. All questions were answered and informed                         consent was obtained.                        - ASA Grade Assessment: II - A patient with mild                         systemic disease.                        After obtaining informed consent, the colonoscope was                         passed under direct vision. Throughout the procedure,                         the patient's blood pressure, pulse, and oxygen                         saturations were monitored continuously. The  Colonoscope was introduced through the anus and                         advanced to the the cecum, identified by the                         appendiceal orifice. The colonoscopy was performed                         with ease. The patient tolerated the procedure well.                         The quality of the  bowel preparation was excellent. Findings:      The perianal and digital rectal examinations were normal.      Non-bleeding internal hemorrhoids were found during retroflexion. The       hemorrhoids were medium-sized and Grade I (internal hemorrhoids that do       not prolapse).      Two sessile polyps were found in the descending colon. The polyps were 6       to 9 mm in size. These polyps were removed with a cold snare. Resection       and retrieval were complete.      A 8 mm polyp was found in the transverse colon. The polyp was sessile.       The polyp was removed with a cold snare. Resection and retrieval were       complete.      The exam was otherwise without abnormality on direct and retroflexion       views. Impression:            - Non-bleeding internal hemorrhoids.                        - Two 6 to 9 mm polyps in the descending colon,                         removed with a cold snare. Resected and retrieved.                        - One 8 mm polyp in the transverse colon, removed with                         a cold snare. Resected and retrieved.                        - The examination was otherwise normal on direct and                         retroflexion views. Recommendation:        - Discharge patient to home (with escort).                        - Resume previous diet.                        - Continue present medications.                        - Await pathology results.                        -  Repeat colonoscopy for surveillance based on                         pathology results. Procedure Code(s):     --- Professional ---                        [phone], Colonoscopy, flexible; with removal of                         tumor(s), polyp(s), or other lesion(s) by snare                         technique Diagnosis Code(s):     --- Professional ---                        K63.5, Polyp of colon                        Z86.010, Personal history of colonic polyps                         K64.0, First degree hemorrhoids CPT copyright 2019 American Medical Association. All rights reserved. The codes documented in this report are preliminary and upon coder review may  be revised to meet current compliance requirements. Jonathon Bellows, MD Jonathon Bellows MD, MD 05/19/2019 2:00:18 PM This report has been signed electronically. Number of Addenda: 0 Note Initiated On: 05/19/2019 1:03 PM Scope Withdrawal Time: 0 hours 23 minutes 40 seconds  Total Procedure Duration: 0 hours 26 minutes 25 seconds  Estimated Blood Loss:  Estimated blood loss: none.      [facility]

## 2019-05-19 NOTE — Anesthesia Preprocedure Evaluation (Addendum)
Anesthesia Evaluation  Patient identified by MRN, date of birth, ID band Patient awake    Reviewed: Allergy & Precautions, H&P , NPO status , Patient's Chart, lab work & pertinent test results  Airway Mallampati: III  TM Distance: >3 FB Neck ROM: full    Dental  (+) Teeth Intact   Pulmonary neg pulmonary ROS, neg shortness of breath, neg COPD, neg recent URI,           Cardiovascular (-) angina(-) Past MI and (-) Cardiac Stents negative cardio ROS  (-) dysrhythmias      Neuro/Psych negative neurological ROS  negative psych ROS   GI/Hepatic negative GI ROS, Neg liver ROS,   Endo/Other  Hypothyroidism   Renal/GU negative Renal ROS  negative genitourinary   Musculoskeletal   Abdominal   Peds  Hematology negative hematology ROS (+)   Anesthesia Other Findings History reviewed. No pertinent past medical history.  Past Surgical History: No date: HEMORRHOID SURGERY No date: HERNIA REPAIR No date: ROTATOR CUFF REPAIR 09/04/2017: SHOULDER ARTHROSCOPY; Left     Comment:  rotator cuff repair, torn labrium, bicep and rotator No date: VASECTOMY     Reproductive/Obstetrics negative OB ROS                            Anesthesia Physical Anesthesia Plan  ASA: I  Anesthesia Plan: General   Post-op Pain Management:    Induction:   PONV Risk Score and Plan: Propofol infusion and TIVA  Airway Management Planned: Natural Airway and Nasal Cannula  Additional Equipment:   Intra-op Plan:   Post-operative Plan:   Informed Consent: I have reviewed the patients History and Physical, chart, labs and discussed the procedure including the risks, benefits and alternatives for the proposed anesthesia with the patient or authorized representative who has indicated his/her understanding and acceptance.     Dental Advisory Given  Plan Discussed with: Anesthesiologist, CRNA and Surgeon  Anesthesia  Plan Comments:        Anesthesia Quick Evaluation

## 2019-05-19 NOTE — H&P (Signed)
Jonathon Bellows, MD [address] [place], [place], [place], [id number], [place], [place], [place], [address] Phone: [phone]  Fax: [phone]  Primary Care Physician:  Jerrol Banana., MD   Pre-Procedure History & Physical: HPI:  Charles Romero is a 58 y.o. male is here for an colonoscopy.   Past Medical History:  Diagnosis Date  . Hypothyroidism     Past Surgical History:  Procedure Laterality Date  . HEMORRHOID SURGERY    . HERNIA REPAIR    . ROTATOR CUFF REPAIR    . SHOULDER ARTHROSCOPY Left 09/04/2017   rotator cuff repair, torn labrium, bicep and rotator  . VASECTOMY      Prior to Admission medications   Medication Sig Start Date End Date Taking? Authorizing Provider  levothyroxine (SYNTHROID) 100 MCG tablet TAKE 1 TABLET EVERY DAY ON EMPTY STOMACHWITH A GLASS OF WATER AT LEAST 30-60 MINBEFORE BREAKFAST 04/21/19  Yes Jerrol Banana., MD  MULTIPLE VITAMIN PO Take by mouth.   Yes [provider]  OMEGA-3 FATTY ACIDS PO Take by mouth.   Yes [provider]  zolpidem (AMBIEN) 10 MG tablet TAKE 1 TABLET BY MOUTH AT BEDTIME AS NEEDED FOR SLEEP 08/20/18  Yes Jerrol Banana., MD    Allergies as of 04/20/2019  . (No Known Allergies)    Family History  Problem Relation Age of Onset  . Arthritis Mother   . Glaucoma Mother   . Colon polyps Father   . Kidney cancer Brother     Social History   Socioeconomic History  . Marital status: Married    Spouse name: Not on file  . Number of children: Not on file  . Years of education: Not on file  . Highest education level: Not on file  Occupational History  . Not on file  Social Needs  . Financial resource strain: Not on file  . Food insecurity    Worry: Not on file    Inability: Not on file  . Transportation needs    Medical: Not on file    Non-medical: Not on file  Tobacco Use  . Smoking status: Never Smoker  . Smokeless tobacco: Never Used  Substance and  Sexual Activity  . Alcohol use: Yes    Comment: 3 drinks a week  . Drug use: No  . Sexual activity: Not on file  Lifestyle  . Physical activity    Days per week: Not on file    Minutes per session: Not on file  . Stress: Not on file  Relationships  . Social Herbalist on phone: Not on file    Gets together: Not on file    Attends religious service: Not on file    Active member of club or organization: Not on file    Attends meetings of clubs or organizations: Not on file    Relationship status: Not on file  . Intimate partner violence    Fear of current or ex partner: Not on file    Emotionally abused: Not on file    Physically abused: Not on file    Forced sexual activity: Not on file  Other Topics Concern  . Not on file  Social History Narrative  . Not on file    Review of Systems: See HPI, otherwise negative ROS  Physical Exam: BP 114/71   Pulse 85   Resp 17   Ht 6' (1.829 m)   Wt 82.6 kg  SpO2 99%   BMI 24.68 kg/m  General:   Alert,  pleasant and cooperative in NAD Head:  Normocephalic and atraumatic. Neck:  Supple; no masses or thyromegaly. Lungs:  Clear throughout to auscultation, normal respiratory effort.    Heart:  +S1, +S2, Regular rate and rhythm, No edema. Abdomen:  Soft, nontender and nondistended. Normal bowel sounds, without guarding, and without rebound.   Neurologic:  Alert and  oriented x4;  grossly normal neurologically.  Impression/Plan: Charles Romero is here for an colonoscopy to be performed for surveillance due to prior history of colon polyps   Risks, benefits, limitations, and alternatives regarding  colonoscopy have been reviewed with the patient.  Questions have been answered.  All parties agreeable.   Jonathon Bellows, MD  05/19/2019, 1:06 PM

## 2019-05-19 NOTE — Transfer of Care (Signed)
Immediate Anesthesia Transfer of Care Note  Patient: ULYSSES TANSIL  Procedure(s) Performed: COLONOSCOPY WITH PROPOFOL (N/A )  Patient Location: PACU  Anesthesia Type:General  Level of Consciousness: awake, alert  and oriented  Airway & Oxygen Therapy: Patient Spontanous Breathing  Post-op Assessment: Report given to RN and Post -op Vital signs reviewed and stable  Post vital signs: Reviewed and stable  Last Vitals:  Vitals Value Taken Time  BP 98/45 05/19/19 1401  Temp 36.4 C 05/19/19 1401  Pulse 71 05/19/19 1401  Resp 19 05/19/19 1401  SpO2 100 % 05/19/19 1401  Vitals shown include unvalidated device data.  Last Pain:  Vitals:   05/19/19 1256  TempSrc: Temporal         Complications: No apparent anesthesia complications

## 2019-05-20 ENCOUNTER — Encounter: Payer: Self-pay | Admitting: Gastroenterology

## 2019-05-20 NOTE — Anesthesia Postprocedure Evaluation (Signed)
Anesthesia Post Note  Patient: Charles Romero  Procedure(s) Performed: COLONOSCOPY WITH PROPOFOL (N/A )  Patient location during evaluation: Endoscopy Anesthesia Type: General Level of consciousness: awake and alert Pain management: pain level controlled Vital Signs Assessment: post-procedure vital signs reviewed and stable Respiratory status: spontaneous breathing, nonlabored ventilation, respiratory function stable and patient connected to nasal cannula oxygen Cardiovascular status: blood pressure returned to baseline and stable Postop Assessment: no apparent nausea or vomiting Anesthetic complications: no     Last Vitals:  Vitals:   05/19/19 1419 05/19/19 1429  BP: 125/78 125/69  Pulse: (!) 50 (!) 50  Resp: 12 12  Temp:    SpO2: 99% 99%    Last Pain:  Vitals:   05/19/19 1429  TempSrc:   PainSc: 0-No pain                 [name]

## 2019-05-21 LAB — SURGICAL PATHOLOGY

## 2019-05-30 ENCOUNTER — Encounter: Payer: Self-pay | Admitting: Gastroenterology

## 2019-07-12 ENCOUNTER — Other Ambulatory Visit: Payer: Self-pay | Admitting: Family Medicine

## 2019-07-12 NOTE — Telephone Encounter (Signed)
Requested medication (s) are due for refill today: yes  Requested medication (s) are on the active medication list: yes  Last refill:  08/20/2018  Future visit scheduled: yes  Notes to clinic:  not delegated    Requested Prescriptions  Pending Prescriptions Disp Refills   zolpidem (AMBIEN) 10 MG tablet [Pharmacy Med Name: ZOLPIDEM TARTRATE 10 MG TAB] 30 tablet     Sig: TAKE ONE TABLET BY MOUTH AT BEDTIME AS NEEDED FOR SLEEP      Not Delegated - Psychiatry:  Anxiolytics/Hypnotics Failed - 07/12/2019  2:03 PM      Failed - This refill cannot be delegated      Failed - Urine Drug Screen completed in last 360 days.      Passed - Valid encounter within last 6 months    Recent Outpatient Visits           3 months ago Annual physical exam   [facility] Jerrol Banana., MD   1 year ago Annual physical exam   [facility] Jerrol Banana., MD   2 years ago Annual physical exam   [facility] Jerrol Banana., MD   2 years ago Contusion of left thigh, subsequent encounter   [facility] Jerrol Banana., MD   3 years ago Annual physical exam   [facility] Jerrol Banana., MD       Future Appointments             In 2 months Jerrol Banana., MD [facility] [place]

## 2019-08-11 DIAGNOSIS — L578 Other skin changes due to chronic exposure to nonionizing radiation: Secondary | ICD-10-CM | POA: Diagnosis not present

## 2019-08-11 DIAGNOSIS — L570 Actinic keratosis: Secondary | ICD-10-CM | POA: Diagnosis not present

## 2019-09-13 DIAGNOSIS — M720 Palmar fascial fibromatosis [Dupuytren]: Secondary | ICD-10-CM | POA: Diagnosis not present

## 2019-09-21 ENCOUNTER — Telehealth: Payer: Self-pay

## 2019-09-21 ENCOUNTER — Ambulatory Visit (INDEPENDENT_AMBULATORY_CARE_PROVIDER_SITE_OTHER): Payer: BC Managed Care – PPO | Admitting: Family Medicine

## 2019-09-21 ENCOUNTER — Other Ambulatory Visit: Payer: Self-pay

## 2019-09-21 ENCOUNTER — Encounter: Payer: Self-pay | Admitting: Family Medicine

## 2019-09-21 VITALS — BP 125/75 | HR 62 | Temp 96.2°F | Resp 18 | Ht 73.0 in | Wt 197.0 lb

## 2019-09-21 DIAGNOSIS — Z125 Encounter for screening for malignant neoplasm of prostate: Secondary | ICD-10-CM | POA: Diagnosis not present

## 2019-09-21 DIAGNOSIS — R972 Elevated prostate specific antigen [PSA]: Secondary | ICD-10-CM

## 2019-09-21 NOTE — Telephone Encounter (Signed)
Copied from [place] [id number]. Topic: General - Inquiry >> Sep 21, 2019 12:16 PM Rainey Pines A wrote: Patient would like a nurse to call him back in regards to what will be done during his appointment today

## 2019-09-21 NOTE — Progress Notes (Signed)
       Patient: Charles Romero Male    DOB: 06-17-1961   59 y.o.   MRN: [id number] Visit Date: 09/21/2019  Today's Provider: Wilhemena Durie, MD   No chief complaint on file.  Subjective:     HPI  Patient feels well.  He comes in today for follow-up of PSA which doubled but was still a very low number at 0.9. He takes one half of an Ambien once or twice a week for insomnia. Hypercholesteremia  From 03/16/2019-labs checked showing-good.  Adult Hypothyroidism From 03/16/2019-labs checked showing-good.  No Known Allergies   Current Outpatient Medications:  .  levothyroxine (SYNTHROID) 100 MCG tablet, TAKE 1 TABLET EVERY DAY ON EMPTY STOMACHWITH A GLASS OF WATER AT LEAST 30-60 MINBEFORE BREAKFAST, Disp: 90 tablet, Rfl: 3 .  MULTIPLE VITAMIN PO, Take by mouth., Disp: , Rfl:  .  OMEGA-3 FATTY ACIDS PO, Take by mouth., Disp: , Rfl:  .  zolpidem (AMBIEN) 10 MG tablet, TAKE ONE TABLET BY MOUTH AT BEDTIME AS NEEDED FOR SLEEP, Disp: 30 tablet, Rfl: 4  Review of Systems  Constitutional: Negative for appetite change, chills and fever.  Eyes: Negative.   Respiratory: Negative for chest tightness, shortness of breath and wheezing.   Cardiovascular: Negative for chest pain and palpitations.  Gastrointestinal: Negative for abdominal pain, nausea and vomiting.  Endocrine: Negative.   Allergic/Immunologic: Negative.   Hematological: Negative.   Psychiatric/Behavioral: Negative.     Social History   Tobacco Use  . Smoking status: Never Smoker  . Smokeless tobacco: Never Used  Substance Use Topics  . Alcohol use: Yes    Comment: 3 drinks a week      Objective:   There were no vitals taken for this visit. There were no vitals filed for this visit.There is no height or weight on file to calculate BMI.   Physical Exam Vitals reviewed.  HENT:     Head: Normocephalic and atraumatic.  Eyes:     General: No scleral icterus. Cardiovascular:     Rate and Rhythm: Normal rate and  regular rhythm.     Heart sounds: Normal heart sounds.  Neurological:     Mental Status: He is alert and oriented to person, place, and time.  Psychiatric:        Mood and Affect: Mood normal.        Behavior: Behavior normal.        Thought Content: Thought content normal.        Judgment: Judgment normal.      No results found for any visits on 09/21/19.     Assessment & Plan     1. Prostate cancer screening   2. Elevated PSA PSA not elevated but trajectory was higher than patient was comfortable with.  Repeat PSA.  If it is stable we will follow-up routinely. - PSA     Lashanti Chambless Cranford Mon, MD  [facility]

## 2019-09-22 ENCOUNTER — Telehealth: Payer: Self-pay

## 2019-09-22 LAB — PSA: Prostate Specific Ag, Serum: 0.8 ng/mL (ref 0.0–4.0)

## 2019-09-22 NOTE — Telephone Encounter (Signed)
-----   Message from Jerrol Banana., MD sent at 09/22/2019 10:46 AM EDT ----- Stable PSA.

## 2019-09-22 NOTE — Telephone Encounter (Signed)
Patient advised and also viewed results on Mychart.

## 2019-12-09 DIAGNOSIS — M720 Palmar fascial fibromatosis [Dupuytren]: Secondary | ICD-10-CM | POA: Diagnosis not present

## 2019-12-09 DIAGNOSIS — M25641 Stiffness of right hand, not elsewhere classified: Secondary | ICD-10-CM | POA: Diagnosis not present

## 2020-01-06 DIAGNOSIS — M720 Palmar fascial fibromatosis [Dupuytren]: Secondary | ICD-10-CM | POA: Diagnosis not present

## 2020-01-13 ENCOUNTER — Other Ambulatory Visit: Payer: Self-pay

## 2020-01-13 ENCOUNTER — Ambulatory Visit (INDEPENDENT_AMBULATORY_CARE_PROVIDER_SITE_OTHER): Payer: BC Managed Care – PPO | Admitting: Dermatology

## 2020-01-13 DIAGNOSIS — L570 Actinic keratosis: Secondary | ICD-10-CM | POA: Diagnosis not present

## 2020-01-13 DIAGNOSIS — Z86018 Personal history of other benign neoplasm: Secondary | ICD-10-CM

## 2020-01-13 DIAGNOSIS — L578 Other skin changes due to chronic exposure to nonionizing radiation: Secondary | ICD-10-CM | POA: Diagnosis not present

## 2020-01-13 NOTE — Progress Notes (Signed)
   Follow-Up Visit   Subjective  Charles Romero is a 59 y.o. male who presents for the following: Follow-up for history of precancerous actinic keratoses as well as history of dysplastic nevi..  Patient here today for 6 month AK follow up at the scalp. Patient not sure if the areas treated have cleared. Nothing new or changing today per patient.   The following portions of the chart were reviewed this encounter and updated as appropriate:  Tobacco  Allergies  Meds  Problems  Med Hx  Surg Hx  Fam Hx     Review of Systems:  No other skin or systemic complaints except as noted in HPI or Assessment and Plan.  Objective  Well appearing patient in no apparent distress; mood and affect are within normal limits.  A focused examination was performed including face, scalp. Relevant physical exam findings are noted in the Assessment and Plan.  Objective  Scalp x 10 (10): Erythematous thin papules/macules with gritty scale.    Assessment & Plan    AK (actinic keratosis) (10) Scalp x 10  Destruction of lesion - Scalp x 10 Complexity: simple   Destruction method: cryotherapy   Informed consent: discussed and consent obtained   Timeout:  patient name, date of birth, surgical site, and procedure verified Lesion destroyed using liquid nitrogen: Yes   Region frozen until ice ball extended beyond lesion: Yes   Outcome: patient tolerated procedure well with no complications   Post-procedure details: wound care instructions given     Actinic Damage - diffuse scaly erythematous macules with underlying dyspigmentation - Recommend daily broad spectrum sunscreen SPF 30+ to sun-exposed areas, reapply every 2 hours as needed.  - Call for new or changing lesions.  Return in about 6 months (around 07/15/2020) for TBSE.  Graciella Belton, RMA, am acting as scribe for Sarina Ser, MD . Documentation: I have reviewed the above documentation for accuracy and completeness, and I agree with the  above.  Sarina Ser, MD

## 2020-01-13 NOTE — Patient Instructions (Addendum)
Recommend daily broad spectrum sunscreen SPF 30+ to sun-exposed areas, reapply every 2 hours as needed. Call for new or changing lesions.  Cryotherapy Aftercare  . Wash gently with soap and water everyday.   . Apply Vaseline and Band-Aid daily until healed.  

## 2020-01-16 ENCOUNTER — Encounter: Payer: Self-pay | Admitting: Dermatology

## 2020-01-25 DIAGNOSIS — H401231 Low-tension glaucoma, bilateral, mild stage: Secondary | ICD-10-CM | POA: Diagnosis not present

## 2020-02-09 ENCOUNTER — Ambulatory Visit: Payer: BC Managed Care – PPO | Admitting: Dermatology

## 2020-03-13 DIAGNOSIS — Z20822 Contact with and (suspected) exposure to covid-19: Secondary | ICD-10-CM | POA: Diagnosis not present

## 2020-03-13 DIAGNOSIS — Z03818 Encounter for observation for suspected exposure to other biological agents ruled out: Secondary | ICD-10-CM | POA: Diagnosis not present

## 2020-03-13 DIAGNOSIS — Z1152 Encounter for screening for COVID-19: Secondary | ICD-10-CM | POA: Diagnosis not present

## 2020-03-14 NOTE — Progress Notes (Deleted)
Complete physical exam   Patient: Charles Romero   DOB: 05-15-1961   59 y.o. Male  MRN: [id number] Visit Date: 03/20/2020  Today's healthcare provider: Wilhemena Durie, MD   No chief complaint on file.  Subjective    Charles Romero is a 59 y.o. male who presents today for a complete physical exam.  He reports consuming a {diet types:17450} diet. {Exercise:19826} He generally feels {well/fairly well/poorly:18703}. He reports sleeping {well/fairly well/poorly:18703}. He {does/does not:200015} have additional problems to discuss today.  HPI  ***  Past Medical History:  Diagnosis Date  . Hypothyroidism    Past Surgical History:  Procedure Laterality Date  . COLONOSCOPY WITH PROPOFOL N/A 05/19/2019   Procedure: COLONOSCOPY WITH PROPOFOL;  Surgeon: Jonathon Bellows, MD;  Location: [facility] ENDOSCOPY;  Service: Gastroenterology;  Laterality: N/A;  . HEMORRHOID SURGERY    . HERNIA REPAIR    . ROTATOR CUFF REPAIR    . SHOULDER ARTHROSCOPY Left 09/04/2017   rotator cuff repair, torn labrium, bicep and rotator  . VASECTOMY     Social History   Socioeconomic History  . Marital status: Married    Spouse name: Not on file  . Number of children: Not on file  . Years of education: Not on file  . Highest education level: Not on file  Occupational History  . Not on file  Tobacco Use  . Smoking status: Never Smoker  . Smokeless tobacco: Never Used  Vaping Use  . Vaping Use: Never used  Substance and Sexual Activity  . Alcohol use: Yes    Comment: 3 drinks a week  . Drug use: No  . Sexual activity: Not on file  Other Topics Concern  . Not on file  Social History Narrative  . Not on file   Social Determinants of Health   Financial Resource Strain:   . Difficulty of Paying Living Expenses: Not on file  Food Insecurity:   . Worried About Charity fundraiser in the Last Year: Not on file  . Ran Out of Food in the Last Year: Not on file  Transportation Needs:   . Lack of  Transportation (Medical): Not on file  . Lack of Transportation (Non-Medical): Not on file  Physical Activity:   . Days of Exercise per Week: Not on file  . Minutes of Exercise per Session: Not on file  Stress:   . Feeling of Stress : Not on file  Social Connections:   . Frequency of Communication with Friends and Family: Not on file  . Frequency of Social Gatherings with Friends and Family: Not on file  . Attends Religious Services: Not on file  . Active Member of Clubs or Organizations: Not on file  . Attends Archivist Meetings: Not on file  . Marital Status: Not on file  Intimate Partner Violence:   . Fear of Current or Ex-Partner: Not on file  . Emotionally Abused: Not on file  . Physically Abused: Not on file  . Sexually Abused: Not on file   Family Status  Relation Name Status  . Mother  Deceased  . Father  Alive  . Brother  Alive  . Brother  Alive   Family History  Problem Relation Age of Onset  . Arthritis Mother   . Glaucoma Mother   . Colon polyps Father   . Kidney cancer Brother    No Known Allergies  Patient Care Team: Jerrol Banana., MD as PCP - General (  Family Medicine)   Medications: Outpatient Medications Prior to Visit  Medication Sig  . levothyroxine (SYNTHROID) 100 MCG tablet TAKE 1 TABLET EVERY DAY ON EMPTY STOMACHWITH A GLASS OF WATER AT LEAST 30-60 MINBEFORE BREAKFAST  . MULTIPLE VITAMIN PO Take by mouth.  . OMEGA-3 FATTY ACIDS PO Take by mouth.  . zolpidem (AMBIEN) 10 MG tablet TAKE ONE TABLET BY MOUTH AT BEDTIME AS NEEDED FOR SLEEP   No facility-administered medications prior to visit.    Review of Systems  {Heme  Chem  Endocrine  Serology  Results Review (optional):23779::" "}  Objective    There were no vitals taken for this visit. {Show previous vital signs (optional):23777::" "}  Physical Exam  ***  Last depression screening scores PHQ 2/9 Scores 03/16/2019 03/10/2018 03/05/2017  PHQ - 2 Score 0 0 0  PHQ- 9  Score - - 0   Last fall risk screening Fall Risk  03/16/2019  Falls in the past year? 0  Number falls in past yr: 0  Injury with Fall? 0  Follow up Falls evaluation completed   Last Audit-C alcohol use screening Alcohol Use Disorder Test (AUDIT) 03/10/2018  1. How often do you have a drink containing alcohol? 3  2. How many drinks containing alcohol do you have on a typical day when you are drinking? 0  3. How often do you have six or more drinks on one occasion? 0  AUDIT-C Score 3   A score of 3 or more in women, and 4 or more in men indicates increased risk for alcohol abuse, EXCEPT if all of the points are from question 1   No results found for any visits on 03/20/20.  Assessment & Plan    Routine Health Maintenance and Physical Exam  Exercise Activities and Dietary recommendations Goals   None     Immunization History  Administered Date(s) Administered  . Hepatitis A 12/25/2004, 03/03/2013  . Influenza Split 03/26/2011  . Influenza,inj,Quad PF,6+ Mos 03/23/2014, 03/28/2015, 03/28/2016, 03/05/2017, 03/10/2018, 03/16/2019  . Td 10/04/2004  . Tdap 03/03/2013    Health Maintenance  Topic Date Due  . COVID-19 Vaccine (1) Never done  . HIV Screening  Never done  . INFLUENZA VACCINE  01/30/2020  . COLONOSCOPY  05/18/2022  . TETANUS/TDAP  03/04/2023  . Hepatitis C Screening  Completed    Discussed health benefits of physical activity, and encouraged him to engage in regular exercise appropriate for his age and condition.  ***  No follow-ups on file.     {provider attestation***:1}   Wilhemena Durie, MD  [facility] [phone] (phone) [phone] (fax)  [place]

## 2020-03-20 ENCOUNTER — Encounter: Payer: Self-pay | Admitting: Family Medicine

## 2020-03-29 ENCOUNTER — Ambulatory Visit: Payer: BC Managed Care – PPO | Admitting: Dermatology

## 2020-03-30 NOTE — Progress Notes (Signed)
Established patient visit   Patient: Charles Romero   DOB: September 15, 1960   59 y.o. Male  MRN: [id number] Visit Date: 04/03/2020  Today's healthcare provider: Wilhemena Durie, MD   Chief Complaint  Patient presents with  . Hyperlipidemia  . Hypothyroidism   Subjective    HPI  Patient needs forms filled out for work.  He feels well and has no complaints.  His only issue is 1 of chronic insomnia for which she takes 1/4-1/2 of an Ambien most nights. Lipid/Cholesterol, follow-up  Last Lipid Panel: Lab Results  Component Value Date   CHOL 194 03/16/2019   LDLCALC 110 (H) 03/16/2019   HDL 66 03/16/2019   TRIG 100 03/16/2019    He was last seen for this 1 years ago.  Management since that visit includes; labs checked showing-good. He reports excellent compliance with treatment. He is not having side effects.     Last metabolic panel Lab Results  Component Value Date   GLUCOSE 88 03/16/2019   NA 139 03/16/2019   K 4.5 03/16/2019   BUN 16 03/16/2019   CREATININE 0.81 03/16/2019   GFRNONAA 97 03/16/2019   GFRAA 112 03/16/2019   CALCIUM 9.5 03/16/2019   AST 21 03/16/2019   ALT 27 03/16/2019   The 10-year ASCVD risk score Mikey Bussing DC Jr., et al., 2013) is: 5.3%  --------------------------------------------------------------------------------------------------- Hypothyroid, follow-up  Lab Results  Component Value Date   TSH 2.640 03/16/2019   TSH 3.690 03/10/2018   TSH 2.44 03/26/2017   Wt Readings from Last 3 Encounters:  04/03/20 189 lb (85.7 kg)  09/21/19 197 lb (89.4 kg)  05/19/19 182 lb (82.6 kg)    He was last seen for hypothyroid 1 years ago.  Management since that visit includes;  Labs good. He reports excellent compliance with treatment. He is not having side effects.   -----------------------------------------------------------------------------------------    Patient Active Problem List   Diagnosis Date Noted  . Allergic rhinitis  03/28/2015  . Enlarged lymph nodes 03/27/2015  . Hypercholesteremia 03/27/2015  . Adult hypothyroidism 03/27/2015  . Cannot sleep 03/27/2015  . Nasal obstruction 03/27/2015       Medications: Outpatient Medications Prior to Visit  Medication Sig  . levothyroxine (SYNTHROID) 100 MCG tablet TAKE 1 TABLET EVERY DAY ON EMPTY STOMACHWITH A GLASS OF WATER AT LEAST 30-60 MINBEFORE BREAKFAST  . MULTIPLE VITAMIN PO Take by mouth.  . OMEGA-3 FATTY ACIDS PO Take by mouth.  . zolpidem (AMBIEN) 10 MG tablet TAKE ONE TABLET BY MOUTH AT BEDTIME AS NEEDED FOR SLEEP   No facility-administered medications prior to visit.    Review of Systems  Constitutional: Negative for appetite change, chills and fever.  Respiratory: Negative for chest tightness, shortness of breath and wheezing.   Cardiovascular: Negative for chest pain and palpitations.  Gastrointestinal: Negative for abdominal pain, nausea and vomiting.      Objective    BP 110/71 (BP Location: Right Arm, Patient Position: Sitting, Cuff Size: Normal)   Pulse (!) 54   Temp 98.2 F (36.8 C) (Oral)   Ht 6' 0.5" (1.842 m)   Wt 189 lb (85.7 kg)   BMI 25.28 kg/m    Physical Exam Vitals reviewed.  Constitutional:      Appearance: Normal appearance.  HENT:     Head: Normocephalic and atraumatic.  Eyes:     General: No scleral icterus. Cardiovascular:     Rate and Rhythm: Normal rate and regular rhythm.     Heart  sounds: Normal heart sounds.  Pulmonary:     Effort: Pulmonary effort is normal.     Breath sounds: Normal breath sounds.  Skin:    General: Skin is warm and dry.  Neurological:     General: No focal deficit present.     Mental Status: He is alert and oriented to person, place, and time.  Psychiatric:        Mood and Affect: Mood normal.        Behavior: Behavior normal.        Thought Content: Thought content normal.        Judgment: Judgment normal.       No results found for any visits on 04/03/20.   Assessment & Plan     1. Hypercholesteremia  - Lipid panel - Comprehensive metabolic panel - TSH  2. Adult hypothyroidism  - Comprehensive metabolic panel - TSH  3. Allergic rhinitis, unspecified seasonality, unspecified trigger  - CBC with Differential/Platelet  4. Need for influenza vaccination  - Flu Vaccine QUAD 6+ mos PF IM (Fluarix Quad PF) 5.  Chronic insomnia Consider Belsomra  No follow-ups on file.      I, Wilhemena Durie, MD, have reviewed all documentation for this visit. The documentation on 04/05/20 for the exam, diagnosis, procedures, and orders are all accurate and complete.    Hulan Szumski Cranford Mon, MD  [facility] [phone] (phone) [phone] (fax)  [place]

## 2020-04-03 ENCOUNTER — Other Ambulatory Visit: Payer: Self-pay

## 2020-04-03 ENCOUNTER — Ambulatory Visit (INDEPENDENT_AMBULATORY_CARE_PROVIDER_SITE_OTHER): Payer: BC Managed Care – PPO | Admitting: Family Medicine

## 2020-04-03 ENCOUNTER — Encounter: Payer: Self-pay | Admitting: Family Medicine

## 2020-04-03 VITALS — BP 110/71 | HR 54 | Temp 98.2°F | Ht 72.5 in | Wt 189.0 lb

## 2020-04-03 DIAGNOSIS — Z23 Encounter for immunization: Secondary | ICD-10-CM | POA: Diagnosis not present

## 2020-04-03 DIAGNOSIS — J309 Allergic rhinitis, unspecified: Secondary | ICD-10-CM | POA: Diagnosis not present

## 2020-04-03 DIAGNOSIS — E039 Hypothyroidism, unspecified: Secondary | ICD-10-CM | POA: Diagnosis not present

## 2020-04-03 DIAGNOSIS — F5101 Primary insomnia: Secondary | ICD-10-CM

## 2020-04-03 DIAGNOSIS — E7800 Pure hypercholesterolemia, unspecified: Secondary | ICD-10-CM | POA: Diagnosis not present

## 2020-04-04 LAB — CBC WITH DIFFERENTIAL/PLATELET
Basophils Absolute: 0.1 10*3/uL (ref 0.0–0.2)
Basos: 2 %
EOS (ABSOLUTE): 0.2 10*3/uL (ref 0.0–0.4)
Eos: 3 %
Hematocrit: 44.4 % (ref 37.5–51.0)
Hemoglobin: 14.8 g/dL (ref 13.0–17.7)
Immature Grans (Abs): 0.1 10*3/uL (ref 0.0–0.1)
Immature Granulocytes: 1 %
Lymphocytes Absolute: 2 10*3/uL (ref 0.7–3.1)
Lymphs: 31 %
MCH: 30.8 pg (ref 26.6–33.0)
MCHC: 33.3 g/dL (ref 31.5–35.7)
MCV: 93 fL (ref 79–97)
Monocytes Absolute: 0.5 10*3/uL (ref 0.1–0.9)
Monocytes: 8 %
Neutrophils Absolute: 3.4 10*3/uL (ref 1.4–7.0)
Neutrophils: 55 %
Platelets: 324 10*3/uL (ref 150–450)
RBC: 4.8 x10E6/uL (ref 4.14–5.80)
RDW: 12.2 % (ref 11.6–15.4)
WBC: 6.3 10*3/uL (ref 3.4–10.8)

## 2020-04-04 LAB — COMPREHENSIVE METABOLIC PANEL
ALT: 28 IU/L (ref 0–44)
AST: 23 IU/L (ref 0–40)
Albumin/Globulin Ratio: 2.1 (ref 1.2–2.2)
Albumin: 4.5 g/dL (ref 3.8–4.9)
Alkaline Phosphatase: 52 IU/L (ref 44–121)
BUN/Creatinine Ratio: 18 (ref 10–24)
BUN: 16 mg/dL (ref 8–27)
Bilirubin Total: 0.7 mg/dL (ref 0.0–1.2)
CO2: 24 mmol/L (ref 20–29)
Calcium: 9.5 mg/dL (ref 8.6–10.2)
Chloride: 102 mmol/L (ref 96–106)
Creatinine, Ser: 0.88 mg/dL (ref 0.76–1.27)
GFR calc Af Amer: 108 mL/min/{1.73_m2} (ref >59)
GFR calc non Af Amer: 93 mL/min/{1.73_m2} (ref >59)
Globulin, Total: 2.1 g/dL (ref 1.5–4.5)
Glucose: 92 mg/dL (ref 65–99)
Potassium: 4.3 mmol/L (ref 3.5–5.2)
Sodium: 139 mmol/L (ref 134–144)
Total Protein: 6.6 g/dL (ref 6.0–8.5)

## 2020-04-04 LAB — LIPID PANEL
Chol/HDL Ratio: 3.3 ratio (ref 0.0–5.0)
Cholesterol, Total: 190 mg/dL (ref 100–199)
HDL: 58 mg/dL (ref >39)
LDL Chol Calc (NIH): 113 mg/dL — ABNORMAL HIGH (ref 0–99)
Triglycerides: 106 mg/dL (ref 0–149)
VLDL Cholesterol Cal: 19 mg/dL (ref 5–40)

## 2020-04-04 LAB — TSH: TSH: 3.05 u[IU]/mL (ref 0.450–4.500)

## 2020-04-10 ENCOUNTER — Telehealth: Payer: Self-pay

## 2020-04-10 MED ORDER — BELSOMRA 10 MG PO TABS: 10.0000 mg | ORAL_TABLET | Freq: Every evening | ORAL | 0 refills | Status: DC | PRN

## 2020-04-10 NOTE — Telephone Encounter (Signed)
Copied from [place] [phone]. Topic: General - Other >> Apr 10, 2020 10:17 AM Keene Breath wrote: Reason for CRM: Patient called to ask that the doctor send in a script for [place] which patient stated the doctor said he would prescribe at his last visit.  Patient stated it has not been sent to the pharmacy as of yet. Please advise and call patient to discuss at [phone]

## 2020-04-10 NOTE — Telephone Encounter (Signed)
Prescription has been sent in  

## 2020-04-10 NOTE — Addendum Note (Signed)
Addended by: Eulas Post on: 04/10/2020 03:53 PM   Modules accepted: Orders

## 2020-04-10 NOTE — Telephone Encounter (Signed)
Please advise 

## 2020-04-21 DIAGNOSIS — Z03818 Encounter for observation for suspected exposure to other biological agents ruled out: Secondary | ICD-10-CM | POA: Diagnosis not present

## 2020-04-21 DIAGNOSIS — Z20822 Contact with and (suspected) exposure to covid-19: Secondary | ICD-10-CM | POA: Diagnosis not present

## 2020-04-25 ENCOUNTER — Other Ambulatory Visit: Payer: Self-pay | Admitting: Family Medicine

## 2020-04-25 DIAGNOSIS — F5101 Primary insomnia: Secondary | ICD-10-CM

## 2020-04-25 NOTE — Telephone Encounter (Signed)
Patient came by office to check on the status of the Incentive Form that was supposed to be completed and faxed on behalf of him from his visit with Dr. Rosanna Randy on Oct. 4, 2021.  Due by Friday Oct 29th.  Patient also wanted to have [place] reach out to his insurance to approve the Belsomra that Dr. Rosanna Randy prescribed for him.  Please call pt back at [phone] to let him know the status on both.  Thanks, [employer]

## 2020-04-26 NOTE — Telephone Encounter (Signed)
I do not think insurance is going to cover the [place].  We can go back to the zolpidem.  To my knowledge I filled out all the paperwork from earlier this month.  I do not have any.

## 2020-05-01 NOTE — Telephone Encounter (Signed)
Form was faxed on 04/19/2020.

## 2020-05-02 MED ORDER — ZOLPIDEM TARTRATE 10 MG PO TABS: 10.0000 mg | ORAL_TABLET | Freq: Every evening | ORAL | 4 refills | Status: DC | PRN

## 2020-05-02 NOTE — Addendum Note (Signed)
Addended by: Eulas Post on: 05/02/2020 03:13 PM   Modules accepted: Orders

## 2020-05-02 NOTE — Telephone Encounter (Signed)
Refaxed form 05/01/2020. Patient was advised. Patient also stated he is okay with going back on zolpidem.

## 2020-05-02 NOTE — Addendum Note (Signed)
Addended by: Julieta Bellini on: 05/02/2020 03:08 PM   Modules accepted: Orders

## 2020-06-27 ENCOUNTER — Other Ambulatory Visit: Payer: Self-pay | Admitting: Family Medicine

## 2020-06-27 DIAGNOSIS — E039 Hypothyroidism, unspecified: Secondary | ICD-10-CM

## 2020-07-12 ENCOUNTER — Encounter: Payer: Self-pay | Admitting: Family Medicine

## 2020-07-12 ENCOUNTER — Ambulatory Visit (INDEPENDENT_AMBULATORY_CARE_PROVIDER_SITE_OTHER): Payer: BC Managed Care – PPO | Admitting: Family Medicine

## 2020-07-12 ENCOUNTER — Other Ambulatory Visit: Payer: Self-pay

## 2020-07-12 VITALS — BP 115/77 | HR 63 | Temp 98.7°F | Resp 16 | Ht 71.5 in | Wt 194.6 lb

## 2020-07-12 DIAGNOSIS — R55 Syncope and collapse: Secondary | ICD-10-CM | POA: Diagnosis not present

## 2020-07-12 DIAGNOSIS — F5101 Primary insomnia: Secondary | ICD-10-CM | POA: Diagnosis not present

## 2020-07-12 DIAGNOSIS — E7800 Pure hypercholesterolemia, unspecified: Secondary | ICD-10-CM

## 2020-07-12 DIAGNOSIS — Z0000 Encounter for general adult medical examination without abnormal findings: Secondary | ICD-10-CM | POA: Diagnosis not present

## 2020-07-12 DIAGNOSIS — E039 Hypothyroidism, unspecified: Secondary | ICD-10-CM

## 2020-07-12 DIAGNOSIS — Z1389 Encounter for screening for other disorder: Secondary | ICD-10-CM

## 2020-07-12 LAB — POCT URINALYSIS DIPSTICK
Bilirubin, UA: NEGATIVE
Blood, UA: NEGATIVE
Glucose, UA: NEGATIVE
Ketones, UA: NEGATIVE
Leukocytes, UA: NEGATIVE
Nitrite, UA: NEGATIVE
Protein, UA: NEGATIVE
Spec Grav, UA: >=1.03 — AB (ref 1.010–1.025)
Urobilinogen, UA: 0.2 E.U./dL
pH, UA: 5 (ref 5.0–8.0)

## 2020-07-12 NOTE — Patient Instructions (Signed)

## 2020-07-12 NOTE — Progress Notes (Signed)
Complete physical exam   Patient: Charles Romero   DOB: July 05, 1960   60 y.o. Male  MRN: [id number] Visit Date: 07/12/2020  Today's healthcare provider: Wilhemena Durie, MD   Chief Complaint  Patient presents with  . Annual Exam   Subjective    Charles Romero is a 60 y.o. male who presents today for a complete physical exam.  Her and 2 grandsons aged 2 years and 1 year. He reports consuming a general diet. exercising up to 5x a week He generally feels well. He reports sleeping fairly well. He does have additional problems to discuss today. Patient reports that he has had a few instances over the past several months where he felt like he was choking while eating, patient denies any symptoms associated with acid reflux. Patient reports that choking feeling while eating has been intermittent, patient reports that last incident occurred late September, early October.  HPI  Patient takes a rare piece of an Ambien to sleep at night.  He has used this for years.  Maybe once or twice a week.  About 2 months ago he had an episode where he felt hot and sweaty and then nauseated and then had a short syncopal episode.  He did not fall or hit his head or injure himself.  No other symptoms.  Once he was awake he was fine.  No antecedent illness.  No palpitations chest pain or shortness of breath.  Past Medical History:  Diagnosis Date  . Hypothyroidism    Past Surgical History:  Procedure Laterality Date  . COLONOSCOPY WITH PROPOFOL N/A 05/19/2019   Procedure: COLONOSCOPY WITH PROPOFOL;  Surgeon: Jonathon Bellows, MD;  Location: [facility] ENDOSCOPY;  Service: Gastroenterology;  Laterality: N/A;  . HEMORRHOID SURGERY    . HERNIA REPAIR    . ROTATOR CUFF REPAIR    . SHOULDER ARTHROSCOPY Left 09/04/2017   rotator cuff repair, torn labrium, bicep and rotator  . VASECTOMY     Social History   Socioeconomic History  . Marital status: Married    Spouse name: Not on file  . Number of children:  Not on file  . Years of education: Not on file  . Highest education level: Not on file  Occupational History  . Not on file  Tobacco Use  . Smoking status: Never Smoker  . Smokeless tobacco: Never Used  Vaping Use  . Vaping Use: Never used  Substance and Sexual Activity  . Alcohol use: Yes    Comment: 3 drinks a week  . Drug use: No  . Sexual activity: Not on file  Other Topics Concern  . Not on file  Social History Narrative  . Not on file   Social Determinants of Health   Financial Resource Strain: Not on file  Food Insecurity: Not on file  Transportation Needs: Not on file  Physical Activity: Not on file  Stress: Not on file  Social Connections: Not on file  Intimate Partner Violence: Not on file   Family Status  Relation Name Status  . Mother  Deceased  . Father  Alive  . Brother  Alive  . Brother  Alive   Family History  Problem Relation Age of Onset  . Arthritis Mother   . Glaucoma Mother   . Colon polyps Father   . Kidney cancer Brother    No Known Allergies  Patient Care Team: Jerrol Banana., MD as PCP - General (Family Medicine)   Medications: Outpatient Medications  Prior to Visit  Medication Sig  . levothyroxine (SYNTHROID) 100 MCG tablet TAKE 1 TABLET EVERY DAY ON EMPTY STOMACHWITH A GLASS OF WATER AT LEAST 30-60 MINBEFORE BREAKFAST  . MULTIPLE VITAMIN PO Take by mouth.  . OMEGA-3 FATTY ACIDS PO Take by mouth.  . Suvorexant (BELSOMRA) 10 MG TABS Take 10 mg by mouth at bedtime as needed.  . zolpidem (AMBIEN) 10 MG tablet Take 1 tablet (10 mg total) by mouth at bedtime as needed. for sleep   No facility-administered medications prior to visit.    Review of Systems  All other systems reviewed and are negative.      Objective    BP 115/77   Pulse 63   Temp 98.7 F (37.1 C) (Oral)   Resp 16   Ht 5' 11.5" (1.816 m)   Wt 194 lb 9.6 oz (88.3 kg)   SpO2 98%   BMI 26.76 kg/m  BP Readings from Last 3 Encounters:  07/12/20 115/77   04/03/20 110/71  09/21/19 125/75   Wt Readings from Last 3 Encounters:  07/12/20 194 lb 9.6 oz (88.3 kg)  04/03/20 189 lb (85.7 kg)  09/21/19 197 lb (89.4 kg)      Physical Exam Vitals reviewed.  Constitutional:      Appearance: He is well-developed.  HENT:     Head: Normocephalic and atraumatic.     Right Ear: External ear normal.     Left Ear: External ear normal.     Nose: Nose normal.  Eyes:     Conjunctiva/sclera: Conjunctivae normal.     Pupils: Pupils are equal, round, and reactive to light.  Cardiovascular:     Rate and Rhythm: Normal rate and regular rhythm.     Heart sounds: Normal heart sounds.  Pulmonary:     Effort: Pulmonary effort is normal.     Breath sounds: Normal breath sounds.  Abdominal:     General: Bowel sounds are normal.     Palpations: Abdomen is soft.  Genitourinary:    Penis: Normal.      Testes: Normal.     Prostate: Normal.     Rectum: Normal.  Musculoskeletal:        General: Normal range of motion.     Cervical back: Normal range of motion and neck supple.     Comments: Dupeytrens contractures of both hands.  Skin:    General: Skin is warm and dry.  Neurological:     General: No focal deficit present.     Mental Status: He is alert and oriented to person, place, and time.  Psychiatric:        Mood and Affect: Mood normal.        Behavior: Behavior normal.        Thought Content: Thought content normal.        Judgment: Judgment normal.     ECG reveals sinus bradycardia rate of 52 with no ischemic changes.  Last depression screening scores PHQ 2/9 Scores 07/12/2020 04/03/2020 03/16/2019  PHQ - 2 Score 0 0 0  PHQ- 9 Score 0 3 -   Last fall risk screening Fall Risk  04/03/2020  Falls in the past year? 0  Number falls in past yr: 0  Injury with Fall? 0  Risk for fall due to : No Fall Risks  Follow up Falls evaluation completed   Last Audit-C alcohol use screening Alcohol Use Disorder Test (AUDIT) 07/12/2020  1. How often do  you have a drink containing alcohol?  3  2. How many drinks containing alcohol do you have on a typical day when you are drinking? 0  3. How often do you have six or more drinks on one occasion? 0  AUDIT-C Score 3   A score of 3 or more in women, and 4 or more in men indicates increased risk for alcohol abuse, EXCEPT if all of the points are from question 1   No results found for any visits on 07/12/20.  Assessment & Plan    Routine Health Maintenance and Physical Exam  Exercise Activities and Dietary recommendations Goals   None     Immunization History  Administered Date(s) Administered  . Hepatitis A 12/25/2004, 03/03/2013  . Influenza Split 03/26/2011  . Influenza,inj,Quad PF,6+ Mos 03/23/2014, 03/28/2015, 03/28/2016, 03/05/2017, 03/10/2018, 03/16/2019, 04/03/2020  . Td 10/04/2004  . Tdap 03/03/2013    Health Maintenance  Topic Date Due  . COVID-19 Vaccine (1) Never done  . HIV Screening  Never done  . COLONOSCOPY (Pts 45-47yrs Insurance coverage will need to be confirmed)  05/18/2022  . TETANUS/TDAP  03/04/2023  . INFLUENZA VACCINE  Completed  . Hepatitis C Screening  Completed    Discussed health benefits of physical activity, and encouraged him to engage in regular exercise appropriate for his age and condition.  1. Annual physical exam Follow-up 1 year. - POCT urinalysis dipstick  2. Screening for blood or protein in urine  - POCT urinalysis dipstick  3. Near syncope I think he had vasovagal syncope.  ECG is is mildly bradycardic and this could have been the issue also.  When this and if it recurs would quickly refer to cardiology for evaluation.  Intervention at this time. - EKG 12-Lead  4. Hypercholesteremia   5. Adult hypothyroidism   6. Primary insomnia Continue as needed Ambien.   No follow-ups on file.     I, Wilhemena Durie, MD, have reviewed all documentation for this visit. The documentation on 07/22/20 for the exam, diagnosis,  procedures, and orders are all accurate and complete.    Jayline Kilburg Cranford Mon, MD  [facility] [phone] (phone) [phone] (fax)  [place]

## 2020-08-03 ENCOUNTER — Encounter: Payer: BC Managed Care – PPO | Admitting: Dermatology

## 2020-09-25 ENCOUNTER — Other Ambulatory Visit: Payer: Self-pay

## 2020-09-25 ENCOUNTER — Ambulatory Visit (INDEPENDENT_AMBULATORY_CARE_PROVIDER_SITE_OTHER): Payer: BC Managed Care – PPO | Admitting: Dermatology

## 2020-09-25 DIAGNOSIS — Z1283 Encounter for screening for malignant neoplasm of skin: Secondary | ICD-10-CM

## 2020-09-25 DIAGNOSIS — L820 Inflamed seborrheic keratosis: Secondary | ICD-10-CM

## 2020-09-25 DIAGNOSIS — L570 Actinic keratosis: Secondary | ICD-10-CM

## 2020-09-25 DIAGNOSIS — Z85828 Personal history of other malignant neoplasm of skin: Secondary | ICD-10-CM

## 2020-09-25 DIAGNOSIS — L578 Other skin changes due to chronic exposure to nonionizing radiation: Secondary | ICD-10-CM

## 2020-09-25 DIAGNOSIS — D229 Melanocytic nevi, unspecified: Secondary | ICD-10-CM

## 2020-09-25 DIAGNOSIS — L814 Other melanin hyperpigmentation: Secondary | ICD-10-CM | POA: Diagnosis not present

## 2020-09-25 DIAGNOSIS — D1800 Hemangioma unspecified site: Secondary | ICD-10-CM

## 2020-09-25 DIAGNOSIS — L821 Other seborrheic keratosis: Secondary | ICD-10-CM

## 2020-09-25 NOTE — Patient Instructions (Addendum)
Cryotherapy Aftercare  . Wash gently with soap and water everyday.   Marland Kitchen Apply Vaseline and Band-Aid daily until healed.   Melanoma ABCDEs  Melanoma is the most dangerous type of skin cancer, and is the leading cause of death from skin disease.  You are more likely to develop melanoma if you:  Have light-colored skin, light-colored eyes, or red or blond hair  Spend a lot of time in the sun  Tan regularly, either outdoors or in a tanning bed  Have had blistering sunburns, especially during childhood  Have a close family member who has had a melanoma  Have atypical moles or large birthmarks  Early detection of melanoma is key since treatment is typically straightforward and cure rates are extremely high if we catch it early.   The first sign of melanoma is often a change in a mole or a new dark spot.  The ABCDE system is a way of remembering the signs of melanoma.  A for asymmetry:  The two halves do not match. B for border:  The edges of the growth are irregular. C for color:  A mixture of colors are present instead of an even brown color. D for diameter:  Melanomas are usually (but not always) greater than 27mm - the size of a pencil eraser. E for evolution:  The spot keeps changing in size, shape, and color.  Please check your skin once per month between visits. You can use a small mirror in front and a large mirror behind you to keep an eye on the back side or your body.   If you see any new or changing lesions before your next follow-up, please call to schedule a visit.  Please continue daily skin protection including broad spectrum sunscreen SPF 30+ to sun-exposed areas, reapplying every 2 hours as needed when you're outdoors.   Staying in the shade or wearing long sleeves, sun glasses (UVA+UVB protection) and wide brim hats (4-inch brim around the entire circumference of the hat) are also recommended for sun protection.   If you have any questions or concerns for your  doctor, please call our main line at [phone] and press option 4 to reach your doctor's medical assistant. If no one answers, please leave a voicemail as directed and we will return your call as soon as possible. Messages left after 4 pm will be answered the following business day.   You may also send [place] a message via [place]. We typically respond to MyChart messages within 1-2 business days.  For prescription refills, please ask your pharmacy to contact our office. Our fax number is [phone].  If you have an urgent issue when the clinic is closed that cannot wait until the next business day, you can page your doctor at the number below.    Please note that while we do our best to be available for urgent issues outside of office hours, we are not available 24/7.   If you have an urgent issue and are unable to reach [place], you may choose to seek medical care at your doctor's office, retail clinic, urgent care center, or emergency room.  If you have a medical emergency, please immediately call 911 or go to the emergency department.  Pager Numbers  - Dr. Nehemiah Massed: [phone]  - Dr. Laurence Ferrari: [phone]  - Dr. Nicole Kindred: [phone]  In the event of inclement weather, please call our main line at [phone] for an update on the status of any delays or closures.  Dermatology Medication  Tips: Please keep the boxes that topical medications come in in order to help keep track of the instructions about where and how to use these. Pharmacies typically print the medication instructions only on the boxes and not directly on the medication tubes.   If your medication is too expensive, please contact our office at [phone] option 4 or send [place] a message through [place].   We are unable to tell what your co-pay for medications will be in advance as this is different depending on your insurance coverage. However, we may be able to find a substitute medication at lower cost or fill out paperwork  to get insurance to cover a needed medication.   If a prior authorization is required to get your medication covered by your insurance company, please allow [place] 1-2 business days to complete this process.  Drug prices often vary depending on where the prescription is filled and some pharmacies may offer cheaper prices.  The website www.goodrx.com contains coupons for medications through different pharmacies. The prices here do not account for what the cost may be with help from insurance (it may be cheaper with your insurance), but the website can give you the price if you did not use any insurance.  - You can print the associated coupon and take it with your prescription to the pharmacy.  - You may also stop by our office during regular business hours and pick up a GoodRx coupon card.  - If you need your prescription sent electronically to a different pharmacy, notify our office through [facility] or by phone at [phone] option 4.

## 2020-09-25 NOTE — Progress Notes (Unsigned)
Follow-Up Visit   Subjective  Charles Romero is a 60 y.o. male who presents for the following: tbse (Patient here today for tbse. Patient states he has a spot on right temple and a couple of spots on right forearm. Patient also reports rough spot behind right ear. /). Patient has history of bcc on right mid dorsum forearm and left proximal dorsum forearm at elbow. He also history of several mild to moderate dysplastic nevi.   The patient presents for Total-Body Skin Exam (TBSE) for skin cancer screening and mole check.  The following portions of the chart were reviewed this encounter and updated as appropriate:  Tobacco  Allergies  Meds  Problems  Med Hx  Surg Hx  Fam Hx       Objective  Well appearing patient in no apparent distress; mood and affect are within normal limits.  A full examination was performed including scalp, head, eyes, ears, nose, lips, neck, chest, axillae, abdomen, back, buttocks, bilateral upper extremities, bilateral lower extremities, hands, feet, fingers, toes, fingernails, and toenails. All findings within normal limits unless otherwise noted below.  Objective  scalp/forehead/temples x  12 (12): Erythematous thin papules/macules with gritty scale.   Objective  right infrauricular x 1, right dorsum hand x 1, Right forearm distal antecubital x 1, left dorsum forearm x 2 (5): Erythematous keratotic or waxy stuck-on papule or plaque.     Assessment & Plan  Actinic keratosis (12) scalp/forehead/temples x  12 Discussed  - PDT may consider at discussing at next appointment  Prior to procedure, discussed risks of blister formation, small wound, skin dyspigmentation, or rare scar following cryotherapy.   Destruction of lesion - scalp/forehead/temples x  12 Complexity: simple   Destruction method: cryotherapy   Informed consent: discussed and consent obtained   Timeout:  patient name, date of birth, surgical site, and procedure verified Lesion destroyed  using liquid nitrogen: Yes   Region frozen until ice ball extended beyond lesion: Yes   Outcome: patient tolerated procedure well with no complications   Post-procedure details: wound care instructions given    Inflamed seborrheic keratosis (5) right infrauricular x 1, right dorsum hand x 1, Right forearm distal antecubital x 1, left dorsum forearm x 2 Prior to procedure, discussed risks of blister formation, small wound, skin dyspigmentation, or rare scar following cryotherapy.   Destruction of lesion - right infrauricular x 1, right dorsum hand x 1, Right forearm distal antecubital x 1, left dorsum forearm x 2 Complexity: simple   Destruction method: cryotherapy   Informed consent: discussed and consent obtained   Timeout:  patient name, date of birth, surgical site, and procedure verified Lesion destroyed using liquid nitrogen: Yes   Region frozen until ice ball extended beyond lesion: Yes   Outcome: patient tolerated procedure well with no complications   Post-procedure details: wound care instructions given    Skin cancer screening  Lentigines - Scattered tan macules - Due to sun exposure - Benign-appering, observe - Recommend daily broad spectrum sunscreen SPF 30+ to sun-exposed areas, reapply every 2 hours as needed. - Call for any changes  Seborrheic Keratoses - Stuck-on, waxy, tan-brown papules and/or plaques  - Benign-appearing - Discussed benign etiology and prognosis. - Observe - Call for any changes  Melanocytic Nevi - Tan-brown and/or pink-flesh-colored symmetric macules and papules - Benign appearing on exam today - Observation - Call clinic for new or changing moles - Recommend daily use of broad spectrum spf 30+ sunscreen to sun-exposed areas.  Hemangiomas - Red papules - Discussed benign nature - Observe - Call for any changes  Actinic Damage - Severe, confluent actinic changes with pre-cancerous actinic keratoses  - Severe, chronic, not at goal,  secondary to cumulative UV radiation exposure over time - diffuse scaly erythematous macules and papules with underlying dyspigmentation - Discussed Prescription "Field Treatment" for Severe, Chronic Confluent Actinic Changes with Pre-Cancerous Actinic Keratoses Field treatment involves treatment of an entire area of skin that has confluent Actinic Changes (Sun/ Ultraviolet light damage) and PreCancerous Actinic Keratoses by method of PhotoDynamic Therapy (PDT) and/or prescription Topical Chemotherapy agents such as 5-fluorouracil, 5-fluorouracil/calcipotriene, and/or imiquimod.  The purpose is to decrease the number of clinically evident and subclinical PreCancerous lesions to prevent progression to development of skin cancer by chemically destroying early precancer changes that may or may not be visible.  It has been shown to reduce the risk of developing skin cancer in the treated area. As a result of treatment, redness, scaling, crusting, and open sores may occur during treatment course. One or more than one of these methods may be used and may have to be used several times to control, suppress and eliminate the PreCancerous changes. Discussed treatment course, expected reaction, and possible side effects. - Recommend daily broad spectrum sunscreen SPF 30+ to sun-exposed areas, reapply every 2 hours as needed.  - Staying in the shade or wearing long sleeves, sun glasses (UVA+UVB protection) and wide brim hats (4-inch brim around the entire circumference of the hat) are also recommended. - Call for new or changing lesions. - consider field treatment at next visit.  History of Basal Cell Carcinoma of the Skin - No evidence of recurrence today L prox dorsum forearm at the elbow near the antecubital - superficial and right mid dorsum forearm  - Recommend regular full body skin exams - Recommend daily broad spectrum sunscreen SPF 30+ to sun-exposed areas, reapply every 2 hours as needed.  - Call if any  new or changing lesions are noted between office visits  Skin cancer screening performed today.  Return in about 6 months (around 03/28/2021) for ak followup scalp and forearms .  IRuthell Rummage, CMA, am acting as scribe for Sarina Ser, MD.  Documentation: I have reviewed the above documentation for accuracy and completeness, and I agree with the above.  Sarina Ser, MD

## 2020-09-27 ENCOUNTER — Encounter: Payer: Self-pay | Admitting: Dermatology

## 2020-10-11 DIAGNOSIS — H43812 Vitreous degeneration, left eye: Secondary | ICD-10-CM | POA: Diagnosis not present

## 2020-11-09 ENCOUNTER — Ambulatory Visit (INDEPENDENT_AMBULATORY_CARE_PROVIDER_SITE_OTHER): Payer: BC Managed Care – PPO | Admitting: Family Medicine

## 2020-11-09 ENCOUNTER — Other Ambulatory Visit: Payer: Self-pay

## 2020-11-09 VITALS — BP 126/70 | HR 73 | Ht 73.0 in | Wt 195.6 lb

## 2020-11-09 DIAGNOSIS — F5101 Primary insomnia: Secondary | ICD-10-CM

## 2020-11-09 DIAGNOSIS — R55 Syncope and collapse: Secondary | ICD-10-CM | POA: Diagnosis not present

## 2020-11-09 NOTE — Progress Notes (Signed)
Established patient visit   Patient: Charles Romero   DOB: [birth year]   60 y.o. Male  MRN: [id number] Visit Date: 11/09/2020  Today's healthcare provider: Wilhemena Durie, MD   Chief Complaint  Patient presents with  . Dizziness   Subjective    HPI  He has had no further episodes of dizziness.  No palpitations no syncope or presyncope. He continues to take occasional sleeping pills.  He has no complaints today. Dizziness  He reports having dizziness that occurred 6 months ago. He describes it as feeling light headed, occurs one time, and typically lasted 30 second time span.  It typically occurs when he was standing up on bus.  Associated symptoms: No hearing loss No tinnitus  Yes chest discomfort - feels little "stitch" in chest: within 10 days No heart palpitations  No heart racing No numbness or tingling of extremities  No nausea No vomiting  No speech difficulty Yes visual changes - unrelated: patient has detached anatomy of eye    Wt Readings from Last 3 Encounters:  07/12/20 194 lb 9.6 oz (88.3 kg)  04/03/20 189 lb (85.7 kg)  09/21/19 197 lb (89.4 kg)    BP Readings from Last 3 Encounters:  07/12/20 115/77  04/03/20 110/71  09/21/19 125/75      Lab Results  Component Value Date   WBC 6.3 04/03/2020   HGB 14.8 04/03/2020   HCT 44.4 04/03/2020   MCV 93 04/03/2020   PLT 324 04/03/2020   Lab Results  Component Value Date   NA 139 04/03/2020   K 4.3 04/03/2020   CO2 24 04/03/2020   BUN 16 04/03/2020   CREATININE 0.88 04/03/2020   CALCIUM 9.5 04/03/2020   GLUCOSE 92 04/03/2020     --------------------------------------------------------------------------------------------------- ---------------------------------------------------------------------------------------------------       Medications: Outpatient Medications Prior to Visit  Medication Sig  . levothyroxine (SYNTHROID) 100 MCG tablet TAKE 1 TABLET EVERY DAY ON EMPTY STOMACHWITH A  GLASS OF WATER AT LEAST 30-60 MINBEFORE BREAKFAST  . MULTIPLE VITAMIN PO Take by mouth.  . OMEGA-3 FATTY ACIDS PO Take by mouth.  . zolpidem (AMBIEN) 10 MG tablet Take 1 tablet (10 mg total) by mouth at bedtime as needed. for sleep  . [DISCONTINUED] Suvorexant (BELSOMRA) 10 MG TABS Take 10 mg by mouth at bedtime as needed. (Patient not taking: Reported on 11/09/2020)   No facility-administered medications prior to visit.    Review of Systems  Constitutional: Negative for activity change and fatigue.  Respiratory: Negative for cough and shortness of breath.   Cardiovascular: Negative for chest pain, palpitations and leg swelling.  Neurological: Negative for dizziness and headaches.  Psychiatric/Behavioral: Negative for self-injury, sleep disturbance and suicidal ideas. The patient is not nervous/anxious.         Objective    There were no vitals taken for this visit. BP Readings from Last 3 Encounters:  11/09/20 126/70  07/12/20 115/77  04/03/20 110/71   Wt Readings from Last 3 Encounters:  11/09/20 195 lb 9.6 oz (88.7 kg)  07/12/20 194 lb 9.6 oz (88.3 kg)  04/03/20 189 lb (85.7 kg)       Physical Exam Vitals reviewed.  Constitutional:      Appearance: Normal appearance.  HENT:     Head: Normocephalic and atraumatic.  Eyes:     General: No scleral icterus. Cardiovascular:     Rate and Rhythm: Normal rate and regular rhythm.     Heart sounds: Normal heart sounds.  Pulmonary:     Effort: Pulmonary effort is normal.     Breath sounds: Normal breath sounds.  Skin:    General: Skin is warm and dry.  Neurological:     General: No focal deficit present.     Mental Status: He is alert and oriented to person, place, and time.  Psychiatric:        Mood and Affect: Mood normal.        Behavior: Behavior normal.        Thought Content: Thought content normal.        Judgment: Judgment normal.       No results found for any visits on 11/09/20.  Assessment & Plan      1. Near syncope/dizziness This has not recurred.  Would refer to cardiology as first evaluation.  No work-up at present.  2. Primary insomnia Continue as needed Ambien   No follow-ups on file.      I, Wilhemena Durie, MD, have reviewed all documentation for this visit. The documentation on 11/13/20 for the exam, diagnosis, procedures, and orders are all accurate and complete.    Romero Letizia Cranford Mon, MD  [facility] [phone] (phone) [phone] (fax)  [place]

## 2020-11-13 DIAGNOSIS — H43812 Vitreous degeneration, left eye: Secondary | ICD-10-CM | POA: Diagnosis not present

## 2021-02-23 ENCOUNTER — Other Ambulatory Visit: Payer: Self-pay | Admitting: Family Medicine

## 2021-02-23 DIAGNOSIS — F5101 Primary insomnia: Secondary | ICD-10-CM

## 2021-02-23 NOTE — Telephone Encounter (Signed)
Requested medication (s) are due for refill today: yes  Requested medication (s) are on the active medication list: yes  Last refill:  05/02/20 #30 4 refills  Future visit scheduled: yes in 1 month  Notes to clinic:  not delegated per protocol     Requested Prescriptions  Pending Prescriptions Disp Refills   zolpidem (AMBIEN) 10 MG tablet [Pharmacy Med Name: ZOLPIDEM TARTRATE 10 MG TAB] 30 tablet     Sig: TAKE ONE TABLET AT BEDTIME IF NEEDED FORSLEEP     Not Delegated - Psychiatry:  Anxiolytics/Hypnotics Failed - 02/23/2021  2:59 PM      Failed - This refill cannot be delegated      Failed - Urine Drug Screen completed in last 360 days      Passed - Valid encounter within last 6 months    Recent Outpatient Visits           3 months ago Near syncope   [facility] Jerrol Banana., MD   7 months ago Annual physical exam   [facility] Jerrol Banana., MD   10 months ago [place] Jerrol Banana., MD   1 year ago Prostate cancer screening   [facility] Jerrol Banana., MD   1 year ago Annual physical exam   [facility] Jerrol Banana., MD       Future Appointments             In 3 weeks Ralene Bathe, MD [place]   In 1 month Jerrol Banana., MD [facility], [place]

## 2021-02-26 NOTE — Telephone Encounter (Signed)
Please advise refill?  LOV: 11/09/2020 NOV: 04/23/2021  Last refill: 05/02/2020 #30 w/4 refills

## 2021-03-20 ENCOUNTER — Other Ambulatory Visit: Payer: Self-pay

## 2021-03-20 ENCOUNTER — Ambulatory Visit (INDEPENDENT_AMBULATORY_CARE_PROVIDER_SITE_OTHER): Payer: BC Managed Care – PPO | Admitting: Dermatology

## 2021-03-20 DIAGNOSIS — L570 Actinic keratosis: Secondary | ICD-10-CM

## 2021-03-20 DIAGNOSIS — L578 Other skin changes due to chronic exposure to nonionizing radiation: Secondary | ICD-10-CM | POA: Diagnosis not present

## 2021-03-20 DIAGNOSIS — L820 Inflamed seborrheic keratosis: Secondary | ICD-10-CM | POA: Diagnosis not present

## 2021-03-20 MED ORDER — FLUOROURACIL 5 % EX CREA: TOPICAL_CREAM | CUTANEOUS | 0 refills | Status: DC

## 2021-03-20 NOTE — Patient Instructions (Signed)

## 2021-03-20 NOTE — Progress Notes (Signed)
Follow-Up Visit   Subjective  Charles Romero is a 60 y.o. male who presents for the following: Follow-up (6 mo f/u for AKs and ISKs treated at last visit with Ln2 on scalp, face, and forearms. Pt states that he has some spots on his legs and torso that he would like checked today. ).  The following portions of the chart were reviewed this encounter and updated as appropriate:  Tobacco  Allergies  Meds  Problems  Med Hx  Surg Hx  Fam Hx     Review of Systems: No other skin or systemic complaints except as noted in HPI or Assessment and Plan.  Objective  Well appearing patient in no apparent distress; mood and affect are within normal limits.  A focused examination was performed including scalp, face, arms, legs. Relevant physical exam findings are noted in the Assessment and Plan.  Scalp (16) Erythematous thin papules/macules with gritty scale.   Assessment & Plan  AK (actinic keratosis) Scalp >15 Actinic keratoses are precancerous spots that appear secondary to cumulative UV radiation exposure/sun exposure over time. They are chronic with expected duration over 1 year. A portion of actinic keratoses will progress to squamous cell carcinoma of the skin. It is not possible to reliably predict which spots will progress to skin cancer and so treatment is recommended to prevent development of skin cancer.  Recommend daily broad spectrum sunscreen SPF 30+ to sun-exposed areas, reapply every 2 hours as needed.  Recommend staying in the shade or wearing long sleeves, sun glasses (UVA+UVB protection) and wide brim hats (4-inch brim around the entire circumference of the hat). Call for new or changing lesions.  Prior to procedure, discussed risks of blister formation, small wound, skin dyspigmentation, or rare scar following cryotherapy. Recommend Vaseline ointment to treated areas while healing.  Start 5Fu/Calcipotriene cream. Apply BID x 7 days to scalp and temples.  Destruction of  lesion - Scalp Complexity: simple   Destruction method: cryotherapy   Informed consent: discussed and consent obtained   Timeout:  patient name, date of birth, surgical site, and procedure verified Lesion destroyed using liquid nitrogen: Yes   Region frozen until ice ball extended beyond lesion: Yes   Outcome: patient tolerated procedure well with no complications   Post-procedure details: wound care instructions given    fluorouracil (EFUDEX) 5 % cream - Scalp Apply twice a day x 7 days to scalp and temples  Inflamed seborrheic keratosis arms and legs x 4 Prior to procedure, discussed risks of blister formation, small wound, skin dyspigmentation, or rare scar following cryotherapy. Recommend Vaseline ointment to treated areas while healing.  Destruction of lesion - arms and legs x 4 Complexity: simple   Destruction method: cryotherapy   Informed consent: discussed and consent obtained   Timeout:  patient name, date of birth, surgical site, and procedure verified Lesion destroyed using liquid nitrogen: Yes   Region frozen until ice ball extended beyond lesion: Yes   Outcome: patient tolerated procedure well with no complications   Post-procedure details: wound care instructions given    Actinic Damage - Severe, confluent actinic changes with pre-cancerous actinic keratoses  - Severe, chronic, not at goal, secondary to cumulative UV radiation exposure over time - diffuse scaly erythematous macules and papules with underlying dyspigmentation - Discussed Prescription "Field Treatment" for Severe, Chronic Confluent Actinic Changes with Pre-Cancerous Actinic Keratoses Field treatment involves treatment of an entire area of skin that has confluent Actinic Changes (Sun/ Ultraviolet light damage) and PreCancerous Actinic  Keratoses by method of PhotoDynamic Therapy (PDT) and/or prescription Topical Chemotherapy agents such as 5-fluorouracil, 5-fluorouracil/calcipotriene, and/or imiquimod.  The  purpose is to decrease the number of clinically evident and subclinical PreCancerous lesions to prevent progression to development of skin cancer by chemically destroying early precancer changes that may or may not be visible.  It has been shown to reduce the risk of developing skin cancer in the treated area. As a result of treatment, redness, scaling, crusting, and open sores may occur during treatment course. One or more than one of these methods may be used and may have to be used several times to control, suppress and eliminate the PreCancerous changes. Discussed treatment course, expected reaction, and possible side effects. - Recommend daily broad spectrum sunscreen SPF 30+ to sun-exposed areas, reapply every 2 hours as needed.  - Staying in the shade or wearing long sleeves, sun glasses (UVA+UVB protection) and wide brim hats (4-inch brim around the entire circumference of the hat) are also recommended. - Call for new or changing lesions. -Start 5Fu/Calcipotriene cream. Apply BID x 7 days to scalp and temples.  Return in about 6 months (around 09/17/2021) for TBSE.  IHarriett Sine, CMA, am acting as scribe for Sarina Ser, MD. Documentation: I have reviewed the above documentation for accuracy and completeness, and I agree with the above.  Sarina Ser, MD

## 2021-03-23 ENCOUNTER — Encounter: Payer: Self-pay | Admitting: Dermatology

## 2021-04-23 ENCOUNTER — Other Ambulatory Visit: Payer: Self-pay

## 2021-04-23 ENCOUNTER — Encounter: Payer: Self-pay | Admitting: Family Medicine

## 2021-04-23 ENCOUNTER — Ambulatory Visit (INDEPENDENT_AMBULATORY_CARE_PROVIDER_SITE_OTHER): Payer: BC Managed Care – PPO | Admitting: Family Medicine

## 2021-04-23 VITALS — BP 102/80 | HR 70 | Temp 97.5°F | Resp 16 | Ht 73.0 in | Wt 188.0 lb

## 2021-04-23 DIAGNOSIS — Z1389 Encounter for screening for other disorder: Secondary | ICD-10-CM

## 2021-04-23 DIAGNOSIS — Z0000 Encounter for general adult medical examination without abnormal findings: Secondary | ICD-10-CM

## 2021-04-23 DIAGNOSIS — J309 Allergic rhinitis, unspecified: Secondary | ICD-10-CM

## 2021-04-23 DIAGNOSIS — R1319 Other dysphagia: Secondary | ICD-10-CM

## 2021-04-23 DIAGNOSIS — Z125 Encounter for screening for malignant neoplasm of prostate: Secondary | ICD-10-CM

## 2021-04-23 DIAGNOSIS — Z23 Encounter for immunization: Secondary | ICD-10-CM | POA: Diagnosis not present

## 2021-04-23 DIAGNOSIS — E039 Hypothyroidism, unspecified: Secondary | ICD-10-CM

## 2021-04-23 DIAGNOSIS — E7800 Pure hypercholesterolemia, unspecified: Secondary | ICD-10-CM

## 2021-04-23 DIAGNOSIS — R972 Elevated prostate specific antigen [PSA]: Secondary | ICD-10-CM

## 2021-04-23 DIAGNOSIS — R1310 Dysphagia, unspecified: Secondary | ICD-10-CM

## 2021-04-23 DIAGNOSIS — F5101 Primary insomnia: Secondary | ICD-10-CM

## 2021-04-23 LAB — POCT URINALYSIS DIPSTICK
Bilirubin, UA: NEGATIVE
Blood, UA: NEGATIVE
Glucose, UA: NEGATIVE
Ketones, UA: NEGATIVE
Leukocytes, UA: NEGATIVE
Nitrite, UA: NEGATIVE
Protein, UA: NEGATIVE
Spec Grav, UA: 1.02 (ref 1.010–1.025)
Urobilinogen, UA: 0.2 E.U./dL
pH, UA: 6.5 (ref 5.0–8.0)

## 2021-04-23 MED ORDER — OMEPRAZOLE 20 MG PO CPDR: 20.0000 mg | DELAYED_RELEASE_CAPSULE | ORAL | 3 refills | Status: DC

## 2021-04-23 NOTE — Progress Notes (Signed)
I,Charles Romero,acting as a scribe for Charles Durie, MD.,have documented all relevant documentation on the behalf of Charles Durie, MD,as directed by  Charles Durie, MD while in the presence of Charles Durie, MD.   Complete physical exam   Patient: Charles Romero   DOB: [birth year]   60 y.o. Male  MRN: [id number] Visit Date: 04/23/2021  Today's healthcare provider: Wilhemena Durie, MD   Chief Complaint  Patient presents with   Annual Exam   Subjective    Charles Romero is a 60 y.o. male who presents today for a complete physical exam.  He reports consuming a general diet. Home exercise routine includes walking and yoga. He generally feels well. He reports sleeping well. He does not have additional problems to discuss today.  He is married and is a father of 2.  Has a son and a daughter and 3 grandsons.  He continues to work full-time. HPI  He now has glaucoma and cataracts followed by ophthalmology. He has hand changes with fifth finger Dupuytren's contracture on both hands. He continues to use a piece of zolpidem on occasion for sleep.  Not regularly. Has occasional solid food dysphagia which is unpredictable .  No other GI symptoms.  Past Medical History:  Diagnosis Date   Basal cell carcinoma 12/07/2013   R mid dorsum forearm - superficial    Basal cell carcinoma 03/17/2017   L prox dorsum forearm at the elbow near the antecubital - superficial    Dysplastic nevus 07/25/2008   L epigastric - moderate   Dysplastic nevus 07/25/2008   upper back right of spine - moderate   Dysplastic nevus 12/15/2008   R buttocks sup lat - mild to moderate   Dysplastic nevus 06/08/2009   R low back medial - mild    Dysplastic nevus 06/08/2009   R low back lat - mild to moderate   Dysplastic nevus 06/08/2009   L mid back paraspinal - mild   Dysplastic nevus 06/08/2009   R epigastric next to midline - severe, excision 08/23/2009   Dysplastic nevus 12/18/2009    LUQA    Dysplastic nevus 12/18/2009   L deltoid    Dysplastic nevus 12/07/2013   R parallel to umbilicus lat abdomen - mild    Dysplastic nevus 04/20/2018   R lower back - moderate   Dysplastic nevus 05/17/2019   R post shoulder at the lat sup scapula - moderate   Hypothyroidism    Past Surgical History:  Procedure Laterality Date   COLONOSCOPY WITH PROPOFOL N/A 05/19/2019   Procedure: COLONOSCOPY WITH PROPOFOL;  Surgeon: Jonathon Bellows, MD;  Location[facility] END[facility]COPY;  Service: Gastroenterology;  Laterality: N/A;   HEMORRHOID SURGERY     HERNIA REPAIR     ROTATOR Romero REPAIR     SHOULDER ARTHROSCOPY Left 09/04/2017   rotator Romero repair, torn labrium, bicep and rotator   VASECTOMY     Social History   Socioeconomic History   Marital status: Married    Spouse name: Not on file   Number of children: Not on file   Years of education: Not on file   Highest education level: Not on file  Occupational History   Not on file  Tobacco Use   Smoking status: Never   Smokeless tobacco: Never  Vaping Use   Vaping Use: Never used  Substance and Sexual Activity   Alcohol use: Yes    Comment: 3 drinks a week   Drug  use: No   Sexual activity: Not on file  Other Topics Concern   Not on file  Social History Narrative   Not on file   Social Determinants of Health   Financial Resource Strain: Not on file  Food Insecurity: Not on file  Transportation Needs: Not on file  Physical Activity: Not on file  Stress: Not on file  Social Connections: Not on file  Intimate Partner Violence: Not on file   Family Status  Relation Name Status   Mother  Deceased   Father  Alive   Brother  Alive   Brother  Alive   Family History  Problem Relation Age of Onset   Arthritis Mother    Glaucoma Mother    Colon polyps Father    Kidney cancer Brother    No Known Allergies  Patient Care Team: Jerrol Banana., MD as PCP - General (Family Medicine)   Medications: Outpatient  Medications Prior to Visit  Medication Sig   fluorouracil (EFUDEX) 5 % cream Apply twice a day x 7 days to scalp and temples   latanoprost (XALATAN) 0.005 % ophthalmic solution 1 drop at bedtime.   levothyroxine (SYNTHROID) 100 MCG tablet TAKE 1 TABLET EVERY DAY ON EMPTY STOMACHWITH A GLASS OF WATER AT LEAST 30-60 MINBEFORE BREAKFAST   MULTIPLE VITAMIN PO Take by mouth.   OMEGA-3 FATTY ACIDS PO Take by mouth.   zolpidem (AMBIEN) 10 MG tablet TAKE ONE TABLET AT BEDTIME IF NEEDED FORSLEEP   No facility-administered medications prior to visit.    Review of Systems  HENT:  Positive for trouble swallowing.   All other systems reviewed and are negative.     Objective    BP 102/80 (BP Location: Right Arm, Patient Position: Sitting, Romero Size: Large)   Pulse 70   Temp (!) 97.5 F (36.4 C) (Temporal)   Resp 16   Ht 6\' 1"  (1.854 m)   Wt 188 lb (85.3 kg)   SpO2 99%   BMI 24.80 kg/m  BP Readings from Last 3 Encounters:  04/23/21 102/80  11/09/20 126/70  07/12/20 115/77   Wt Readings from Last 3 Encounters:  04/23/21 188 lb (85.3 kg)  11/09/20 195 lb 9.6 oz (88.7 kg)  07/12/20 194 lb 9.6 oz (88.3 kg)      Physical Exam Constitutional:      Appearance: Normal appearance. He is normal weight.  HENT:     Head: Normocephalic and atraumatic.     Right Ear: Tympanic membrane, ear canal and external ear normal.     Left Ear: Tympanic membrane, ear canal and external ear normal.     Nose: Nose normal.     Mouth/Throat:     Mouth: Mucous membranes are moist.     Pharynx: Oropharynx is clear.  Eyes:     Extraocular Movements: Extraocular movements intact.     Conjunctiva/sclera: Conjunctivae normal.     Pupils: Pupils are equal, round, and reactive to light.  Cardiovascular:     Rate and Rhythm: Normal rate and regular rhythm.     Pulses: Normal pulses.     Heart sounds: Normal heart sounds.  Pulmonary:     Effort: Pulmonary effort is normal.     Breath sounds: Normal breath  sounds.  Abdominal:     General: Abdomen is flat. Bowel sounds are normal.     Palpations: Abdomen is soft.  Musculoskeletal:        General: Normal range of motion.  Cervical back: Normal range of motion and neck supple.  Skin:    General: Skin is warm and dry.  Neurological:     General: No focal deficit present.     Mental Status: He is alert and oriented to person, place, and time. Mental status is at baseline.  Psychiatric:        Mood and Affect: Mood normal.        Behavior: Behavior normal.        Thought Content: Thought content normal.        Judgment: Judgment normal.      Last depression screening scores PHQ 2/9 Scores 04/23/2021 07/12/2020 04/03/2020  PHQ - 2 Score 0 0 0  PHQ- 9 Score 1 0 3   Last fall risk screening Fall Risk  04/23/2021  Falls in the past year? 0  Number falls in past yr: 0  Injury with Fall? 0  Risk for fall due to : No Fall Risks  Follow up Falls evaluation completed   Last Audit-C alcohol use screening Alcohol Use Disorder Test (AUDIT) 04/23/2021  1. How often do you have a drink containing alcohol? 3  2. How many drinks containing alcohol do you have on a typical day when you are drinking? 0  3. How often do you have six or more drinks on one occasion? 0  AUDIT-C Score 3  4. How often during the last year have you found that you were not able to stop drinking once you had started? 0  5. How often during the last year have you failed to do what was normally expected from you because of drinking? 0  6. How often during the last year have you needed a first drink in the morning to get yourself going after a heavy drinking session? 0  7. How often during the last year have you had a feeling of guilt of remorse after drinking? 0  8. How often during the last year have you been unable to remember what happened the night before because you had been drinking? 0  9. Have you or someone else been injured as a result of your drinking? 0  10. Has  a relative or friend or a doctor or another health worker been concerned about your drinking or suggested you cut down? 0  Alcohol Use Disorder Identification Test Final Score (AUDIT) 3   A score of 3 or more in women, and 4 or more in men indicates increased risk for alcohol abuse, EXCEPT if all of the points are from question 1   No results found for any visits on 04/23/21.  Assessment & Plan    Routine Health Maintenance and Physical Exam  Exercise Activities and Dietary recommendations  Goals   None     Immunization History  Administered Date(s) Administered   Hepatitis A 12/25/2004, 03/03/2013   Influenza Split 03/26/2011   Influenza,inj,Quad PF,6+ Mos 03/23/2014, 03/28/2015, 03/28/2016, 03/05/2017, 03/10/2018, 03/16/2019, 04/03/2020, 04/23/2021   Td 10/04/2004   Tdap 03/03/2013    Health Maintenance  Topic Date Due   COVID-19 Vaccine (1) Never done   Pneumococcal Vaccine 46-42 Years old (1 - PCV) Never done   HIV Screening  Never done   Zoster Vaccines- Shingrix (1 of 2) Never done   COLONOSCOPY (Pts 45-48yrs Insurance coverage will need to be confirmed)  05/18/2022   TETANUS/TDAP  03/04/2023   INFLUENZA VACCINE  Completed   Hepatitis C Screening  Completed   HPV VACCINES  Aged Out  Discussed health benefits of physical activity, and encouraged him to engage in regular exercise appropriate for his age and condition.  1. Annual physical exam  - CBC with Differential/Platelet - Comprehensive metabolic panel - Lipid Panel With LDL/HDL Ratio - TSH  2. Prostate cancer screening  - PSA  3. Need for influenza vaccination  - Flu Vaccine QUAD 6+ mos PF IM (Fluarix Quad PF)  4. Hypercholesteremia  - CBC with Differential/Platelet - Comprehensive metabolic panel - Lipid Panel With LDL/HDL Ratio - TSH  5. Adult hypothyroidism  - CBC with Differential/Platelet - Comprehensive metabolic panel - Lipid Panel With LDL/HDL Ratio - TSH  6. Allergic rhinitis,  unspecified seasonality, unspecified trigger  - CBC with Differential/Platelet - Comprehensive metabolic panel - Lipid Panel With LDL/HDL Ratio - TSH  7. Elevated PSA Follow-up PSA and refer as indicated - PSA  8. Dysphagia, unspecified type  - omeprazole (PRILOSEC) 20 MG capsule; Take 1 capsule (20 mg total) by mouth every morning.  Dispense: 90 capsule; Refill: 3 - Ambulatory referral to Gastroenterology  9. Screening for blood or protein in urine  - POCT urinalysis dipstick--Negative 10.  Dysphagia Omeprazole 20 mg daily and refer to GI.  Return in about 1 year (around 04/23/2022).     I, Charles Durie, MD, have reviewed all documentation for this visit. The documentation on 04/28/21 for the exam, diagnosis, procedures, and orders are all accurate and complete.    Onetha Gaffey Cranford Mon, MD  [facility] [phone] (phone) [phone] (fax)  [place]

## 2021-04-24 LAB — COMPREHENSIVE METABOLIC PANEL WITH GFR
ALT: 43 IU/L (ref 0–44)
AST: 30 IU/L (ref 0–40)
Albumin/Globulin Ratio: 2.3 — ABNORMAL HIGH (ref 1.2–2.2)
Albumin: 4.9 g/dL — ABNORMAL HIGH (ref 3.8–4.8)
Alkaline Phosphatase: 63 IU/L (ref 44–121)
BUN/Creatinine Ratio: 15 (ref 10–24)
BUN: 16 mg/dL (ref 8–27)
Bilirubin Total: 0.6 mg/dL (ref 0.0–1.2)
CO2: 24 mmol/L (ref 20–29)
Calcium: 9.6 mg/dL (ref 8.6–10.2)
Chloride: 101 mmol/L (ref 96–106)
Creatinine, Ser: 1.06 mg/dL (ref 0.76–1.27)
Globulin, Total: 2.1 g/dL (ref 1.5–4.5)
Glucose: 98 mg/dL (ref 70–99)
Potassium: 5.1 mmol/L (ref 3.5–5.2)
Sodium: 140 mmol/L (ref 134–144)
Total Protein: 7 g/dL (ref 6.0–8.5)
eGFR: 80 mL/min/1.73

## 2021-04-24 LAB — CBC WITH DIFFERENTIAL/PLATELET
Basophils Absolute: 0.1 10*3/uL (ref 0.0–0.2)
Basos: 2 %
EOS (ABSOLUTE): 0.4 10*3/uL (ref 0.0–0.4)
Eos: 6 %
Hematocrit: 49 % (ref 37.5–51.0)
Hemoglobin: 16.1 g/dL (ref 13.0–17.7)
Immature Grans (Abs): 0.1 10*3/uL (ref 0.0–0.1)
Immature Granulocytes: 1 %
Lymphocytes Absolute: 1.8 10*3/uL (ref 0.7–3.1)
Lymphs: 29 %
MCH: 29.7 pg (ref 26.6–33.0)
MCHC: 32.9 g/dL (ref 31.5–35.7)
MCV: 90 fL (ref 79–97)
Monocytes Absolute: 0.5 10*3/uL (ref 0.1–0.9)
Monocytes: 8 %
Neutrophils Absolute: 3.4 10*3/uL (ref 1.4–7.0)
Neutrophils: 54 %
Platelets: 371 10*3/uL (ref 150–450)
RBC: 5.42 x10E6/uL (ref 4.14–5.80)
RDW: 11.7 % (ref 11.6–15.4)
WBC: 6.3 10*3/uL (ref 3.4–10.8)

## 2021-04-24 LAB — PSA: Prostate Specific Ag, Serum: 0.9 ng/mL (ref 0.0–4.0)

## 2021-04-24 LAB — LIPID PANEL WITH LDL/HDL RATIO
Cholesterol, Total: 197 mg/dL (ref 100–199)
HDL: 55 mg/dL (ref >39)
LDL Chol Calc (NIH): 123 mg/dL — ABNORMAL HIGH (ref 0–99)
LDL/HDL Ratio: 2.2 ratio (ref 0.0–3.6)
Triglycerides: 106 mg/dL (ref 0–149)
VLDL Cholesterol Cal: 19 mg/dL (ref 5–40)

## 2021-04-24 LAB — TSH: TSH: 3.3 u[IU]/mL (ref 0.450–4.500)

## 2021-06-15 ENCOUNTER — Other Ambulatory Visit: Payer: Self-pay | Admitting: Family Medicine

## 2021-06-15 DIAGNOSIS — E039 Hypothyroidism, unspecified: Secondary | ICD-10-CM

## 2021-06-16 NOTE — Telephone Encounter (Signed)
Requested Prescriptions  Pending Prescriptions Disp Refills   levothyroxine (SYNTHROID) 100 MCG tablet [Pharmacy Med Name: LEVOTHYROXINE SODIUM 100 MCG TAB] 90 tablet 3    Sig: TAKE 1 TABLET EVERY DAY ON EMPTY [place] A GLASS OF WATER AT LEAST 30-60 [place]     Endocrinology:  Hypothyroid Agents Failed - 06/15/2021  1:53 PM      Failed - TSH needs to be rechecked within 3 months after an abnormal result. Refill until TSH is due.      Passed - TSH in normal range and within 360 days    TSH  Date Value Ref Range Status  04/23/2021 3.300 0.450 - 4.500 uIU/mL Final         Passed - Valid encounter within last 12 months    Recent Outpatient Visits          1 month ago Annual physical exam   [facility] Charles Romero., MD   7 months ago Near syncope   [facility] Charles Romero., MD   11 months ago Annual physical exam   [facility] Charles Romero., MD   1 year ago [place] Charles Romero., MD   1 year ago Prostate cancer screening   [facility] Charles Romero., MD      Future Appointments            In 2 months Charles Bathe, MD [place]   In 10 months Charles Romero., MD [facility], [place]

## 2021-06-26 ENCOUNTER — Ambulatory Visit: Payer: Self-pay | Admitting: *Deleted

## 2021-06-26 NOTE — Telephone Encounter (Signed)
Reason for Disposition  [1] HIGH RISK for severe COVID complications (e.g., weak immune system, age > 62 years, obesity with BMI > 25, pregnant, chronic lung disease or other chronic medical condition) AND [2] COVID symptoms (e.g., cough, fever)  (Exceptions: Already seen by PCP and no new or worsening symptoms.)    Denies any high risk factors except his age.  No heart, lung or diabetes problems but wants to know if he should take an antiviral for Covid since he is positive and having symptoms.  Answer Assessment - Initial Assessment Questions 1. COVID-19 DIAGNOSIS: "Who made your COVID-19 diagnosis?" "Was it confirmed by a positive lab test or self-test?" If not diagnosed by a doctor (or NP/PA), ask "Are there lots of cases (community spread) where you live?" Note: See public health department website, if unsure.     Tested for covid with a home test 2. COVID-19 EXPOSURE: "Was there any known exposure to COVID before the symptoms began?" CDC Definition of close contact: within 6 feet (2 meters) for a total of 15 minutes or more over a 24-hour period.      3. ONSET: "When did the COVID-19 symptoms start?"      Started feeling tired Christmas Eve and Christmas Day.   Woke up yesterday feeling bad.   Tested positive. 4. WORST SYMPTOM: "What is your worst symptom?" (e.g., cough, fever, shortness of breath, muscle aches)     *No Answer* 5. COUGH: "Do you have a cough?" If Yes, ask: "How bad is the cough?"       Coughing, sore throat, chest congestion, low grade fever 99.5 this morning. Coughing up mucus can't tell color due to sucking on a lozenge. 6. FEVER: "Do you have a fever?" If Yes, ask: "What is your temperature, how was it measured, and when did it start?"     99.5 this morning 7. RESPIRATORY STATUS: "Describe your breathing?" (e.g., shortness of breath, wheezing, unable to speak)      No wheezing or shortness of breath.   Chest tightness I can feel in there. 8. BETTER-SAME-WORSE: "Are you  getting better, staying the same or getting worse compared to yesterday?"  If getting worse, ask, "In what way?"     Worse 9. HIGH RISK DISEASE: "Do you have any chronic medical problems?" (e.g., asthma, heart or lung disease, weak immune system, obesity, etc.)     No heart or lung issue or diabetes 10. VACCINE: "Have you had the COVID-19 vaccine?" If Yes, ask: "Which one, how many shots, when did you get it?"       Not asked 11. BOOSTER: "Have you received your COVID-19 booster?" If Yes, ask: "Which one and when did you get it?"       Not asked 12. PREGNANCY: "Is there any chance you are pregnant?" "When was your last menstrual period?"       N/A 13. OTHER SYMPTOMS: "Do you have any other symptoms?"  (e.g., chills, fatigue, headache, loss of smell or taste, muscle pain, sore throat)       Low grade fever, sore throat, chest congestion,body aches, fatigue 14. O2 SATURATION MONITOR:  "Do you use an oxygen saturation monitor (pulse oximeter) at home?" If Yes, ask "What is your reading (oxygen level) today?" "What is your usual oxygen saturation reading?" (e.g., 95%)       Not asked  Protocols used: Coronavirus (COVID-19) Diagnosed or Suspected-A-AH

## 2021-06-26 NOTE — Telephone Encounter (Signed)
°  Chief Complaint: positive covid wants to know if he needs/should take an antiviral Symptoms: coughing, chest tightness from congestion, body aches, fever,  Frequency: constantly Pertinent Negatives: Patient denies shortness of breath or wheezing Disposition: [] ED /[] Urgent Care (no appt availability in office) / [] Appointment(In office/virtual)/ []  [facility]/ [x] Home Care/ [] Refused Recommended Disposition  Additional Notes: Message sent to [facility] for them to further advise pt.

## 2021-06-26 NOTE — Telephone Encounter (Signed)
Patient advised as below.  

## 2021-06-27 ENCOUNTER — Ambulatory Visit: Payer: BC Managed Care – PPO | Admitting: Gastroenterology

## 2021-07-16 ENCOUNTER — Other Ambulatory Visit: Payer: Self-pay | Admitting: Family Medicine

## 2021-07-16 DIAGNOSIS — E039 Hypothyroidism, unspecified: Secondary | ICD-10-CM

## 2021-07-16 NOTE — Telephone Encounter (Signed)
Patient states pharmacy has been reaching out since 06/15/2021 regarding his levothyroxine (SYNTHROID) 100 MCG tablet. Patient is now out and would like request sent in today.    [place], [place] Phone:  [phone]  Fax:  [phone]

## 2021-07-16 NOTE — Telephone Encounter (Signed)
Future OV 04/24/22  LOV 04/23/21  Last ordered 06/27/20 #90 plus 3 refills.  Requested Prescriptions  Pending Prescriptions Disp Refills   levothyroxine (SYNTHROID) 100 MCG tablet [Pharmacy Med Name: LEVOTHYROXINE SODIUM 100 MCG TAB] 90 tablet 0    Sig: TAKE 1 TABLET EVERY DAY ON EMPTY [place] A GLASS OF WATER AT LEAST 30-60 [place]     Endocrinology:  Hypothyroid Agents Failed - 07/16/2021 12:03 PM      Failed - TSH needs to be rechecked within 3 months after an abnormal result. Refill until TSH is due.      Passed - TSH in normal range and within 360 days    TSH  Date Value Ref Range Status  04/23/2021 3.300 0.450 - 4.500 uIU/mL Final         Passed - Valid encounter within last 12 months    Recent Outpatient Visits          2 months ago Annual physical exam   [facility] Jerrol Banana., MD   8 months ago Near syncope   [facility] Jerrol Banana., MD   1 year ago Annual physical exam   [facility] Jerrol Banana., MD   1 year ago [place] Jerrol Banana., MD   1 year ago Prostate cancer screening   [facility] Jerrol Banana., MD      Future Appointments            In 1 month Ralene Bathe, MD [place]   In 9 months Jerrol Banana., MD [facility] [place], [place]

## 2021-08-16 ENCOUNTER — Ambulatory Visit: Payer: BC Managed Care – PPO | Admitting: Gastroenterology

## 2021-09-11 ENCOUNTER — Other Ambulatory Visit: Payer: Self-pay

## 2021-09-11 ENCOUNTER — Ambulatory Visit (INDEPENDENT_AMBULATORY_CARE_PROVIDER_SITE_OTHER): Payer: BC Managed Care – PPO | Admitting: Dermatology

## 2021-09-11 DIAGNOSIS — L814 Other melanin hyperpigmentation: Secondary | ICD-10-CM

## 2021-09-11 DIAGNOSIS — L578 Other skin changes due to chronic exposure to nonionizing radiation: Secondary | ICD-10-CM

## 2021-09-11 DIAGNOSIS — Z1283 Encounter for screening for malignant neoplasm of skin: Secondary | ICD-10-CM

## 2021-09-11 DIAGNOSIS — L821 Other seborrheic keratosis: Secondary | ICD-10-CM

## 2021-09-11 DIAGNOSIS — H00011 Hordeolum externum right upper eyelid: Secondary | ICD-10-CM | POA: Diagnosis not present

## 2021-09-11 DIAGNOSIS — Z85828 Personal history of other malignant neoplasm of skin: Secondary | ICD-10-CM

## 2021-09-11 DIAGNOSIS — Z86018 Personal history of other benign neoplasm: Secondary | ICD-10-CM

## 2021-09-11 DIAGNOSIS — L570 Actinic keratosis: Secondary | ICD-10-CM | POA: Diagnosis not present

## 2021-09-11 DIAGNOSIS — D229 Melanocytic nevi, unspecified: Secondary | ICD-10-CM

## 2021-09-11 MED ORDER — DOXYCYCLINE HYCLATE 100 MG PO TABS: 100.0000 mg | ORAL_TABLET | Freq: Two times a day (BID) | ORAL | 0 refills | Status: AC

## 2021-09-11 NOTE — Progress Notes (Signed)
? ?Follow-Up Visit ?  ?Subjective  ?Charles Romero is a 61 y.o. male who presents for the following: Actinic Keratosis (Scalp, 18mf/u, Pt used 5FU/Calcipotriene bid x 7 days ~545mgo, got a reaction) and Total body skin exam (Hx of BCCs, hx of Dysplastic Nevi). ?The patient presents for Total-Body Skin Exam (TBSE) for skin cancer screening and mole check.  The patient has spots, moles and lesions to be evaluated, some may be new or changing and the patient has concerns that these could be cancer. ? ?The following portions of the chart were reviewed this encounter and updated as appropriate:  ? Tobacco  Allergies  Meds  Problems  Med Hx  Surg Hx  Fam Hx   ?  ?Review of Systems:  No other skin or systemic complaints except as noted in HPI or Assessment and Plan. ? ?Objective  ?Well appearing patient in no apparent distress; mood and affect are within normal limits. ? ?A full examination was performed including scalp, head, eyes, ears, nose, lips, neck, chest, axillae, abdomen, back, buttocks, bilateral upper extremities, bilateral lower extremities, hands, feet, fingers, toes, fingernails, and toenails. All findings within normal limits unless otherwise noted below. ? ?Scalp and L ear x 5 (5) ?Erythematous thin papules/macules with gritty scale.  ? ?R upper eyelid ?Inflamed nodule. ? ? ?Assessment & Plan  ? ?AK (actinic keratosis) (5) ?Scalp and L ear x 5 ?Destruction of lesion - Scalp and L ear x 5 ?Complexity: simple   ?Destruction method: cryotherapy   ?Informed consent: discussed and consent obtained   ?Timeout:  patient name, date of birth, surgical site, and procedure verified ?Lesion destroyed using liquid nitrogen: Yes   ?Region frozen until ice ball extended beyond lesion: Yes   ?Outcome: patient tolerated procedure well with no complications   ?Post-procedure details: wound care instructions given   ? ?Related Medications ?fluorouracil (EFUDEX) 5 % cream ?Apply twice a day x 7 days to scalp and  temples ? ?Hordeolum externum of right upper eyelid ?Persistent after hot compresses ?R upper eyelid ?Start Doxycycline '100mg'$  po BID x 7 days.  ?If persistent problems after her doxycycline treatment, consider ophthalmology evaluation. ?Doxycycline should be taken with food to prevent nausea. Do not lay down for 30 minutes after taking. Be cautious with sun exposure and use good sun protection while on this medication. Pregnant women should not take this medication.  ? ?doxycycline (VIBRA-TABS) 100 MG tablet - R upper eyelid ?Take 1 tablet (100 mg total) by mouth 2 (two) times daily for 7 days. Take with food. ? ?Skin cancer screening ? ?History of Basal Cell Carcinoma of the Skin ?- No evidence of recurrence today ?- Recommend regular full body skin exams ?- Recommend daily broad spectrum sunscreen SPF 30+ to sun-exposed areas, reapply every 2 hours as needed.  ?- Call if any new or changing lesions are noted between office visits ? ?History of Dysplastic Nevi ?- No evidence of recurrence today ?- Recommend regular full body skin exams ?- Recommend daily broad spectrum sunscreen SPF 30+ to sun-exposed areas, reapply every 2 hours as needed.  ?- Call if any new or changing lesions are noted between office visits ? ?Lentigines ?- Scattered tan macules ?- Due to sun exposure ?- Benign-appearing, observe ?- Recommend daily broad spectrum sunscreen SPF 30+ to sun-exposed areas, reapply every 2 hours as needed. ?- Call for any changes ? ?Seborrheic Keratoses ?- Stuck-on, waxy, tan-brown papules and/or plaques  ?- Benign-appearing ?- Discussed benign etiology and  prognosis. ?- Observe ?- Call for any changes ? ?Melanocytic Nevi ?- Tan-brown and/or pink-flesh-colored symmetric macules and papules ?- Benign appearing on exam today ?- Observation ?- Call clinic for new or changing moles ?- Recommend daily use of broad spectrum spf 30+ sunscreen to sun-exposed areas.  ? ?Hemangiomas ?- Red papules ?- Discussed benign  nature ?- Observe ?- Call for any changes ? ?Actinic Damage ?- Chronic condition, secondary to cumulative UV/sun exposure ?- diffuse scaly erythematous macules with underlying dyspigmentation ?- Recommend daily broad spectrum sunscreen SPF 30+ to sun-exposed areas, reapply every 2 hours as needed.  ?- Staying in the shade or wearing long sleeves, sun glasses (UVA+UVB protection) and wide brim hats (4-inch brim around the entire circumference of the hat) are also recommended for sun protection.  ?- Call for new or changing lesions. ? ?Skin cancer screening performed today. ? ?Return in about 6 months (around 03/14/2022) for TBSE. ? ?I, Rudell Cobb, CMA, am acting as scribe for Sarina Ser, MD . ?Documentation: I have reviewed the above documentation for accuracy and completeness, and I agree with the above. ? ?Sarina Ser, MD ? ?

## 2021-09-11 NOTE — Patient Instructions (Signed)

## 2021-09-17 ENCOUNTER — Encounter: Payer: Self-pay | Admitting: Dermatology

## 2021-09-18 ENCOUNTER — Other Ambulatory Visit: Payer: Self-pay | Admitting: Family Medicine

## 2021-09-18 DIAGNOSIS — F5101 Primary insomnia: Secondary | ICD-10-CM

## 2021-10-24 ENCOUNTER — Other Ambulatory Visit: Payer: Self-pay

## 2021-10-25 ENCOUNTER — Encounter: Payer: Self-pay | Admitting: Gastroenterology

## 2021-10-25 ENCOUNTER — Ambulatory Visit (INDEPENDENT_AMBULATORY_CARE_PROVIDER_SITE_OTHER): Payer: BC Managed Care – PPO | Admitting: Gastroenterology

## 2021-10-25 VITALS — BP 124/79 | HR 72 | Temp 97.9°F | Ht 73.0 in | Wt 192.2 lb

## 2021-10-25 DIAGNOSIS — R1310 Dysphagia, unspecified: Secondary | ICD-10-CM

## 2021-10-25 NOTE — Progress Notes (Signed)
?  ?Jonathon Bellows MD, MRCP(U.K) ?[address]  ?[address]  ?[place], [place] [address]  ?Main: [phone]  ?Fax: [phone] ? ? ?Gastroenterology Consultation ? ?Referring Provider:     Jonathon Bellows, MD ?Primary Care Physician:  Jerrol Banana., MD ?Primary Gastroenterologist:  Dr. Jonathon Bellows  ?Reason for Consultation:    Dysphagia  ?      ? HPI:   ?Charles Romero is a 61 y.o. y/o male referred for consultation & management  by Dr. Rosanna Randy, Retia Passe., MD.   ?He states that he had to 3 episodes of food it felt like to get stuck in his throat.  Subsequently was started on a course of Prilosec and all discomfort associated with swallowing resolved completely absolutely no issues after that.  Denies any heartburn denies any weight loss not a smoker. ?Performed his colonoscopy in 05/21/2019 and he had 3 tubular adenomas which were resected due for a repeat colonoscopy in 05/20/2022 ? ?Past Medical History:  ?Diagnosis Date  ? Basal cell carcinoma 12/07/2013  ? R mid dorsum forearm - superficial   ? Basal cell carcinoma 03/17/2017  ? L prox dorsum forearm at the elbow near the antecubital - superficial   ? Dysplastic nevus 07/25/2008  ? L epigastric - moderate  ? Dysplastic nevus 07/25/2008  ? upper back right of spine - moderate  ? Dysplastic nevus 12/15/2008  ? R buttocks sup lat - mild to moderate  ? Dysplastic nevus 06/08/2009  ? R low back medial - mild   ? Dysplastic nevus 06/08/2009  ? R low back lat - mild to moderate  ? Dysplastic nevus 06/08/2009  ? L mid back paraspinal - mild  ? Dysplastic nevus 06/08/2009  ? R epigastric next to midline - severe, excision 08/23/2009  ? Dysplastic nevus 12/18/2009  ? LUQA   ? Dysplastic nevus 12/18/2009  ? L deltoid   ? Dysplastic nevus 12/07/2013  ? R parallel to umbilicus lat abdomen - mild   ? Dysplastic nevus 04/20/2018  ? R lower back - moderate  ? Dysplastic nevus 05/17/2019  ? R post shoulder at the lat sup scapula - moderate  ? Hypothyroidism    ? ? ?Past Surgical History:  ?Procedure Laterality Date  ? COLONOSCOPY WITH PROPOFOL N/A 05/19/2019  ? Procedure: COLONOSCOPY WITH PROPOFOL;  Surgeon: Jonathon Bellows, MD;  Location: [facility] ENDOSCOPY;  Service: Gastroenterology;  Laterality: N/A;  ? HEMORRHOID SURGERY    ? HERNIA REPAIR    ? ROTATOR CUFF REPAIR    ? SHOULDER ARTHROSCOPY Left 09/04/2017  ? rotator cuff repair, torn labrium, bicep and rotator  ? VASECTOMY    ? ? ?Prior to Admission medications   ?Medication Sig Start Date End Date Taking? Authorizing Provider  ?latanoprost (XALATAN) 0.005 % ophthalmic solution 1 drop at bedtime. 04/04/21   [provider]  ?levothyroxine (SYNTHROID) 100 MCG tablet TAKE 1 TABLET EVERY DAY ON EMPTY STOMACHWITH A GLASS OF WATER AT LEAST 30-60 MINBEFORE BREAKFAST 07/16/21   Jerrol Banana., MD  ?MULTIPLE VITAMIN PO Take by mouth.    [provider]  ?OMEGA-3 FATTY ACIDS PO Take by mouth.    [provider]  ?omeprazole (PRILOSEC) 20 MG capsule Take 1 capsule (20 mg total) by mouth every morning. 04/23/21   Jonathon Bellows, MD  ?zolpidem (AMBIEN) 10 MG tablet TAKE ONE TABLET AT BEDTIME IF NEEDED FORSLEEP 09/18/21   Jerrol Banana., MD  ? ? ?Family History  ?Problem  Relation Age of Onset  ? Arthritis Mother   ? Glaucoma Mother   ? Colon polyps Father   ? Kidney cancer Brother   ?  ? ?Social History  ? ?Tobacco Use  ? Smoking status: Never  ? Smokeless tobacco: Never  ?Vaping Use  ? Vaping Use: Never used  ?Substance Use Topics  ? Alcohol use: Yes  ?  Comment: 3 drinks a week  ? Drug use: No  ? ? ?Allergies as of 10/25/2021  ? (No Known Allergies)  ? ? ?Review of Systems:    ?All systems reviewed and negative except where noted in HPI. ? ? Physical Exam:  ?BP 124/79   Pulse 72   Temp 97.9 ?F (36.6 ?C) (Oral)   Ht '6\' 1"'$  (1.854 m)   Wt 192 lb 3.2 oz (87.2 kg)   BMI 25.36 kg/m?  ?No LMP for male patient. ?Psych:  Alert and cooperative. Normal mood and affect. ?General:   Alert,  Well-developed,  well-nourished, pleasant and cooperative in NAD ?Head:  Normocephalic and atraumatic. ?Eyes:  Sclera clear, no icterus.   Conjunctiva pink. ?Ears:  Normal auditory acuity. ?Neurologic:  Alert and oriented x3;  grossly normal neurologically. ?Psych:  Alert and cooperative. Normal mood and affect. ? ?Imaging Studies: ?No results found. ? ?Assessment and Plan:  ? ?Charles Romero is a 61 y.o. y/o male has been referred for dysphagia.  At the time of referral he had had few episodes where the food had difficulty going down.  Was commenced on Prilosec and subsequently had no issues at all.  Explained to him that we have options of performing an endoscopy versus watch and wait.  My only concern about watching and waiting was that if the food would get stuck he could have an emergency.  He said he would like to watch for symptoms at the slightest indication that food is getting stuck would give [place] a call to schedule the endoscopy. ? ?Follow-up as needed ? ?Dr Jonathon Bellows MD,MRCP(U.K) ? ?

## 2021-11-15 ENCOUNTER — Telehealth: Payer: Self-pay | Admitting: Gastroenterology

## 2021-11-15 NOTE — Telephone Encounter (Signed)
PT is still having swallowing problems was seen in our office on 04/27 and was told if he was still having problems to call .

## 2021-11-16 NOTE — Telephone Encounter (Signed)
Dr. Vicente Males, patient stated that he is still having dysphagia and he wanted to know what else he could do. Please advise.

## 2021-11-19 ENCOUNTER — Other Ambulatory Visit: Payer: Self-pay

## 2021-11-19 DIAGNOSIS — R1310 Dysphagia, unspecified: Secondary | ICD-10-CM

## 2021-11-19 NOTE — Telephone Encounter (Signed)
Called patient back but had to leave him a voicemail letting him know that Dr. Vicente Males would like for him to have an upper endoscopy due to his dysphagia. Dr. Vicente Males stated that he had discussed this with the patient on his last office visit. Awaiting for patient to return my call so I could schedule an EGD.

## 2021-11-19 NOTE — Telephone Encounter (Signed)
Patient called back and he agreed on scheduling his EGD. He agreed on having it done on 11/27/2021 since there was nothing available this week. However, he stated that if there was an opening on this Wednesday, to let him know. I told him that I will call him if we do.

## 2021-11-21 ENCOUNTER — Other Ambulatory Visit: Payer: Self-pay | Admitting: Family Medicine

## 2021-11-21 DIAGNOSIS — E039 Hypothyroidism, unspecified: Secondary | ICD-10-CM

## 2021-11-21 NOTE — Telephone Encounter (Signed)
There wasn't any opening for today. Therefore, I did not contact the patient. He is still scheduled to be done on Nov 27, 2021.

## 2021-11-23 NOTE — Telephone Encounter (Signed)
Requested Prescriptions  Pending Prescriptions Disp Refills  . levothyroxine (SYNTHROID) 100 MCG tablet [Pharmacy Med Name: LEVOTHYROXINE SODIUM 100 MCG TAB] 90 tablet 0    Sig: TAKE 1 TABLET EVERY DAY ON EMPTY STOMACHWITH A GLASS OF WATER AT LEAST 30-60 [place]     Endocrinology:  Hypothyroid Agents Passed - 11/21/2021  4:32 PM      Passed - TSH in normal range and within 360 days    TSH  Date Value Ref Range Status  04/23/2021 3.300 0.450 - 4.500 uIU/mL Final         Passed - Valid encounter within last 12 months    Recent Outpatient Visits          7 months ago Annual physical exam   [facility] Jerrol Banana., MD   1 year ago Near syncope   [facility] Jerrol Banana., MD   1 year ago Annual physical exam   [facility] Jerrol Banana., MD   1 year ago [place] Jerrol Banana., MD   2 years ago Prostate cancer screening   [facility] Jerrol Banana., MD      Future Appointments            In 4 months Ralene Bathe, MD [place]   In 5 months Jerrol Banana., MD [facility], [place]

## 2021-11-27 ENCOUNTER — Encounter
Admission: RE | Disposition: A | Discharge status: Home / Self Care | Payer: Self-pay | Source: Ambulatory Visit | Attending: Gastroenterology

## 2021-11-27 ENCOUNTER — Ambulatory Visit
Admission: RE | Admit: 2021-11-27 | Discharge: 2021-11-27 | Disposition: A | Discharge status: Home / Self Care | Payer: BC Managed Care – PPO | Source: Ambulatory Visit | Attending: Gastroenterology | Admitting: Gastroenterology

## 2021-11-27 ENCOUNTER — Ambulatory Visit: Payer: BC Managed Care – PPO | Admitting: Registered Nurse

## 2021-11-27 ENCOUNTER — Encounter: Payer: Self-pay | Admitting: Gastroenterology

## 2021-11-27 DIAGNOSIS — Z86018 Personal history of other benign neoplasm: Secondary | ICD-10-CM | POA: Insufficient documentation

## 2021-11-27 DIAGNOSIS — E039 Hypothyroidism, unspecified: Secondary | ICD-10-CM | POA: Diagnosis not present

## 2021-11-27 DIAGNOSIS — Z8719 Personal history of other diseases of the digestive system: Secondary | ICD-10-CM | POA: Diagnosis not present

## 2021-11-27 DIAGNOSIS — K222 Esophageal obstruction: Secondary | ICD-10-CM | POA: Diagnosis not present

## 2021-11-27 DIAGNOSIS — R1310 Dysphagia, unspecified: Secondary | ICD-10-CM | POA: Diagnosis not present

## 2021-11-27 DIAGNOSIS — Z85828 Personal history of other malignant neoplasm of skin: Secondary | ICD-10-CM | POA: Diagnosis not present

## 2021-11-27 DIAGNOSIS — K200 Eosinophilic esophagitis: Secondary | ICD-10-CM | POA: Diagnosis not present

## 2021-11-27 HISTORY — PX: ESOPHAGOGASTRODUODENOSCOPY (EGD) WITH PROPOFOL: SHX5813

## 2021-11-27 SURGERY — ESOPHAGOGASTRODUODENOSCOPY (EGD) WITH PROPOFOL
Anesthesia: General

## 2021-11-27 MED ORDER — GLYCOPYRROLATE 0.2 MG/ML IJ SOLN
INTRAMUSCULAR | Status: AC
  Filled 2021-11-27: qty 1

## 2021-11-27 MED ORDER — PROPOFOL 10 MG/ML IV BOLUS
INTRAVENOUS | Status: DC | PRN
  Administered 2021-11-27: 50 mg via INTRAVENOUS
  Administered 2021-11-27: 100 mg via INTRAVENOUS

## 2021-11-27 MED ORDER — EPHEDRINE 5 MG/ML INJ
INTRAVENOUS | Status: AC
  Filled 2021-11-27: qty 5

## 2021-11-27 MED ORDER — PROPOFOL 500 MG/50ML IV EMUL
INTRAVENOUS | Status: DC | PRN
  Administered 2021-11-27: 200 ug/kg/min via INTRAVENOUS

## 2021-11-27 MED ORDER — LIDOCAINE HCL (CARDIAC) PF 100 MG/5ML IV SOSY
PREFILLED_SYRINGE | INTRAVENOUS | Status: DC | PRN
  Administered 2021-11-27: 100 mg via INTRAVENOUS

## 2021-11-27 MED ORDER — SODIUM CHLORIDE 0.9 % IV SOLN
INTRAVENOUS | Status: DC
  Administered 2021-11-27: 1000 mL via INTRAVENOUS

## 2021-11-27 MED ORDER — ESMOLOL HCL 100 MG/10ML IV SOLN
INTRAVENOUS | Status: AC
  Filled 2021-11-27: qty 10

## 2021-11-27 NOTE — Anesthesia Postprocedure Evaluation (Signed)
Anesthesia Post Note  Patient: DAMONT BALLES  Procedure(s) Performed: ESOPHAGOGASTRODUODENOSCOPY (EGD) WITH PROPOFOL  Patient location during evaluation: Endoscopy Anesthesia Type: General Level of consciousness: awake and alert Pain management: pain level controlled Vital Signs Assessment: post-procedure vital signs reviewed and stable Respiratory status: spontaneous breathing, nonlabored ventilation, respiratory function stable and patient connected to nasal cannula oxygen Cardiovascular status: blood pressure returned to baseline and stable Postop Assessment: no apparent nausea or vomiting Anesthetic complications: no   No notable events documented.   Last Vitals:  Vitals:   11/27/21 1043 11/27/21 1053  BP: 120/78 121/76  Pulse: (!) 51 (!) 44  Resp: 15 15  Temp:    SpO2: 96% 100%    Last Pain:  Vitals:   11/27/21 1053  TempSrc:   PainSc: 0-No pain                 Precious Haws Robben Jagiello

## 2021-11-27 NOTE — H&P (Signed)
Jonathon Bellows, MD [address], [place], [place], [place], [address] [address], [place], [place], [place], [address] Phone: [phone]  Fax: [phone]  Primary Care Physician:  Jerrol Banana., MD   Pre-Procedure History & Physical: HPI:  Charles Romero is a 61 y.o. male is here for an endoscopy    Past Medical History:  Diagnosis Date   Basal cell carcinoma 12/07/2013   R mid dorsum forearm - superficial    Basal cell carcinoma 03/17/2017   L prox dorsum forearm at the elbow near the antecubital - superficial    Dysplastic nevus 07/25/2008   L epigastric - moderate   Dysplastic nevus 07/25/2008   upper back right of spine - moderate   Dysplastic nevus 12/15/2008   R buttocks sup lat - mild to moderate   Dysplastic nevus 06/08/2009   R low back medial - mild    Dysplastic nevus 06/08/2009   R low back lat - mild to moderate   Dysplastic nevus 06/08/2009   L mid back paraspinal - mild   Dysplastic nevus 06/08/2009   R epigastric next to midline - severe, excision 08/23/2009   Dysplastic nevus 12/18/2009   LUQA    Dysplastic nevus 12/18/2009   L deltoid    Dysplastic nevus 12/07/2013   R parallel to umbilicus lat abdomen - mild    Dysplastic nevus 04/20/2018   R lower back - moderate   Dysplastic nevus 05/17/2019   R post shoulder at the lat sup scapula - moderate   Hypothyroidism     Past Surgical History:  Procedure Laterality Date   COLONOSCOPY WITH PROPOFOL N/A 05/19/2019   Procedure: COLONOSCOPY WITH PROPOFOL;  Surgeon: Jonathon Bellows, MD;  Location: [facility] ENDOSCOPY;  Service: Gastroenterology;  Laterality: N/A;   HEMORRHOID SURGERY     HERNIA REPAIR     ROTATOR CUFF REPAIR     SHOULDER ARTHROSCOPY Left 09/04/2017   rotator cuff repair, torn labrium, bicep and rotator   VASECTOMY      Prior to Admission medications   Medication Sig Start Date End Date Taking? Authorizing Provider  latanoprost (XALATAN) 0.005 % ophthalmic solution 1  drop at bedtime. 04/04/21  Yes [provider]  levothyroxine (SYNTHROID) 100 MCG tablet TAKE 1 TABLET EVERY DAY ON EMPTY STOMACHWITH A GLASS OF WATER AT LEAST 30-60 MINBEFORE BREAKFAST 11/23/21  Yes Jerrol Banana., MD  zolpidem (AMBIEN) 10 MG tablet TAKE ONE TABLET AT BEDTIME IF NEEDED FORSLEEP 09/18/21  Yes Jerrol Banana., MD  MULTIPLE VITAMIN PO Take by mouth. Patient not taking: Reported on 10/25/2021    [provider]  OMEGA-3 FATTY ACIDS PO Take by mouth. Patient not taking: Reported on 10/25/2021    [provider]  omeprazole (PRILOSEC) 20 MG capsule Take 1 capsule (20 mg total) by mouth every morning. Patient not taking: Reported on 11/27/2021 04/23/21   Jonathon Bellows, MD    Allergies as of 11/19/2021   (No Known Allergies)    Family History  Problem Relation Age of Onset   Arthritis Mother    Glaucoma Mother    Colon polyps Father    Kidney cancer Brother     Social History   Socioeconomic History   Marital status: Married    Spouse name: Not on file   Number of children: Not on file   Years of education: Not on file   Highest education level: Not on file  Occupational History  Not on file  Tobacco Use   Smoking status: Never   Smokeless tobacco: Never  Vaping Use   Vaping Use: Never used  Substance and Sexual Activity   Alcohol use: Yes    Comment: 3 drinks a week   Drug use: No   Sexual activity: Not on file  Other Topics Concern   Not on file  Social History Narrative   Not on file   Social Determinants of Health   Financial Resource Strain: Not on file  Food Insecurity: Not on file  Transportation Needs: Not on file  Physical Activity: Not on file  Stress: Not on file  Social Connections: Not on file  Intimate Partner Violence: Not on file    Review of Systems: See HPI, otherwise negative ROS  Physical Exam: BP 121/81   Pulse (!) 45   Temp (!) 96.8 F (36 C) (Temporal)   Resp 18   Ht 6' (1.829 m)    Wt 86.4 kg   SpO2 99%   BMI 25.82 kg/m  General:   Alert,  pleasant and cooperative in NAD Head:  Normocephalic and atraumatic. Neck:  Supple; no masses or thyromegaly. Lungs:  Clear throughout to auscultation, normal respiratory effort.    Heart:  +S1, +S2, Regular rate and rhythm, No edema. Abdomen:  Soft, nontender and nondistended. Normal bowel sounds, without guarding, and without rebound.   Neurologic:  Alert and  oriented x4;  grossly normal neurologically.  Impression/Plan: Charles Romero is here for an endoscopy  to be performed for  evaluation of dysphagia    Risks, benefits, limitations, and alternatives regarding endoscopy have been reviewed with the patient.  Questions have been answered.  All parties agreeable.   Jonathon Bellows, MD  11/27/2021, 10:07 AM

## 2021-11-27 NOTE — Transfer of Care (Signed)
Immediate Anesthesia Transfer of Care Note  Patient: SEBRON MCMAHILL  Procedure(s) Performed: ESOPHAGOGASTRODUODENOSCOPY (EGD) WITH PROPOFOL  Patient Location: Endoscopy Unit  Anesthesia Type:General  Level of Consciousness: drowsy  Airway & Oxygen Therapy: Patient Spontanous Breathing  Post-op Assessment: Report given to RN and Post -op Vital signs reviewed and stable  Post vital signs: Reviewed and stable  Last Vitals:  Vitals Value Taken Time  BP    Temp    Pulse 56 11/27/21 1033  Resp 14 11/27/21 1033  SpO2 96 % 11/27/21 1033  Vitals shown include unvalidated device data.  Last Pain:  Vitals:   11/27/21 0952  TempSrc: Temporal  PainSc: 0-No pain         Complications: No notable events documented.

## 2021-11-27 NOTE — Anesthesia Preprocedure Evaluation (Signed)
Anesthesia Evaluation  Patient identified by MRN, date of birth, ID band Patient awake    Reviewed: Allergy & Precautions, NPO status , Patient's Chart, lab work & pertinent test results  History of Anesthesia Complications Negative for: history of anesthetic complications  Airway Mallampati: II  TM Distance: >3 FB Neck ROM: full    Dental  (+) Chipped   Pulmonary neg pulmonary ROS, neg shortness of breath,    Pulmonary exam normal        Cardiovascular Exercise Tolerance: Good (-) anginanegative cardio ROS Normal cardiovascular exam     Neuro/Psych negative neurological ROS  negative psych ROS   GI/Hepatic negative GI ROS, Neg liver ROS, neg GERD  ,  Endo/Other  Hypothyroidism   Renal/GU negative Renal ROS  negative genitourinary   Musculoskeletal   Abdominal   Peds  Hematology negative hematology ROS (+)   Anesthesia Other Findings Patient reports that they do that think that any food or pills are stuck in their throat at this time.  Past Medical History: 12/07/2013: Basal cell carcinoma     Comment:  R mid dorsum forearm - superficial  03/17/2017: Basal cell carcinoma     Comment:  L prox dorsum forearm at the elbow near the antecubital               - superficial  07/25/2008: Dysplastic nevus     Comment:  L epigastric - moderate 07/25/2008: Dysplastic nevus     Comment:  upper back right of spine - moderate 12/15/2008: Dysplastic nevus     Comment:  R buttocks sup lat - mild to moderate 06/08/2009: Dysplastic nevus     Comment:  R low back medial - mild  06/08/2009: Dysplastic nevus     Comment:  R low back lat - mild to moderate 06/08/2009: Dysplastic nevus     Comment:  L mid back paraspinal - mild 06/08/2009: Dysplastic nevus     Comment:  R epigastric next to midline - severe, excision               08/23/2009 12/18/2009: Dysplastic nevus     Comment:  LUQA  12/18/2009: Dysplastic nevus      Comment:  L deltoid  12/07/2013: Dysplastic nevus     Comment:  R parallel to umbilicus lat abdomen - mild  04/20/2018: Dysplastic nevus     Comment:  R lower back - moderate 05/17/2019: Dysplastic nevus     Comment:  R post shoulder at the lat sup scapula - moderate No date: Hypothyroidism  Past Surgical History: 05/19/2019: COLONOSCOPY WITH PROPOFOL; N/A     Comment:  Procedure: COLONOSCOPY WITH PROPOFOL;  Surgeon: Jonathon Bellows, MD;  Location: [facility] ENDOSCOPY;  Service:               Gastroenterology;  Laterality: N/A; No date: HEMORRHOID SURGERY No date: HERNIA REPAIR No date: ROTATOR CUFF REPAIR 09/04/2017: SHOULDER ARTHROSCOPY; Left     Comment:  rotator cuff repair, torn labrium, bicep and rotator No date: VASECTOMY  BMI    Body Mass Index: 25.82 kg/m      Reproductive/Obstetrics negative OB ROS                             Anesthesia Physical Anesthesia Plan  ASA: 2  Anesthesia Plan: General   Post-op Pain Management:    Induction: Intravenous  PONV Risk Score and Plan: Propofol infusion and TIVA  Airway Management Planned: Natural Airway and Nasal Cannula  Additional Equipment:   Intra-op Plan:   Post-operative Plan:   Informed Consent: I have reviewed the patients History and Physical, chart, labs and discussed the procedure including the risks, benefits and alternatives for the proposed anesthesia with the patient or authorized representative who has indicated his/her understanding and acceptance.     Dental Advisory Given  Plan Discussed with: Anesthesiologist, CRNA and Surgeon  Anesthesia Plan Comments: (Patient consented for risks of anesthesia including but not limited to:  - adverse reactions to medications - risk of airway placement if required - damage to eyes, teeth, lips or other oral mucosa - nerve damage due to positioning  - sore throat or hoarseness - Damage to heart, brain, nerves, lungs, other  parts of body or loss of life  Patient voiced understanding.)        Anesthesia Quick Evaluation

## 2021-11-27 NOTE — Op Note (Signed)
[facility] Gastroenterology Patient Name: Charles Romero Procedure Date: 11/27/2021 10:16 AM MRN: [id number] Account #: [id number] Date of Birth: 04/20/1961 Admit Type: Outpatient Age: 61 Room: [facility] ENDO ROOM 2 Gender: Male Note Status: Finalized Instrument Name: Upper Endoscope [id number] Procedure:             Upper GI endoscopy Indications:           Dysphagia Providers:             Jonathon Bellows MD, MD Referring MD:          Janine Ores. Rosanna Randy, MD (Referring MD) Medicines:             Monitored Anesthesia Care Complications:         No immediate complications. Procedure:             Pre-Anesthesia Assessment:                        - Prior to the procedure, a History and Physical was                         performed, and patient medications, allergies and                         sensitivities were reviewed. The patient's tolerance                         of previous anesthesia was reviewed.                        - The risks and benefits of the procedure and the                         sedation options and risks were discussed with the                         patient. All questions were answered and informed                         consent was obtained.                        - ASA Grade Assessment: II - A patient with mild                         systemic disease.                        After obtaining informed consent, the endoscope was                         passed under direct vision. Throughout the procedure,                         the patient's blood pressure, pulse, and oxygen                         saturations were monitored continuously. The Endoscope                         was introduced  through the mouth, and advanced to the                         third part of duodenum. The upper GI endoscopy was                         accomplished with ease. The patient tolerated the                         procedure well. Findings:      The examined duodenum  was normal.      The stomach was normal.      The cardia and gastric fundus were normal on retroflexion.      One benign-appearing, intrinsic moderate (circumferential scarring or       stenosis; an endoscope may pass) stenosis was found at the       gastroesophageal junction. This stenosis measured 1.6 cm (inner       diameter) x less than one cm (in length). The stenosis was traversed. A       TTS dilator was passed through the scope. Dilation with a 15-16.5-18 mm       balloon dilator was performed to 18 mm. The dilation site was examined       and showed moderate mucosal disruption. Biopsies were taken with a cold       forceps for histology. Impression:            - Normal examined duodenum.                        - Normal stomach.                        - Benign-appearing esophageal stenosis. Dilated.                         Biopsied. Recommendation:        - Await pathology results.                        - Discharge patient to home (with escort).                        - Resume previous diet.                        - Continue present medications.                        - Return to my office as previously scheduled. Procedure Code(s):     --- Professional ---                        [phone], Esophagogastroduodenoscopy, flexible,                         transoral; with transendoscopic balloon dilation of                         esophagus (less than 30 mm diameter)                        43239, 59, Esophagogastroduodenoscopy, flexible,  transoral; with biopsy, single or multiple Diagnosis Code(s):     --- Professional ---                        K22.2, Esophageal obstruction                        R13.10, Dysphagia, unspecified CPT copyright 2019 American Medical Association. All rights reserved. The codes documented in this report are preliminary and upon coder review may  be revised to meet current compliance requirements. Jonathon Bellows, MD Jonathon Bellows MD,  MD 11/27/2021 10:32:10 AM This report has been signed electronically. Number of Addenda: 0 Note Initiated On: 11/27/2021 10:16 AM Estimated Blood Loss:  Estimated blood loss: none.      [facility]

## 2021-11-28 ENCOUNTER — Encounter: Payer: Self-pay | Admitting: Gastroenterology

## 2021-11-28 LAB — SURGICAL PATHOLOGY

## 2021-11-28 NOTE — Progress Notes (Signed)
Biopsies show he has eosinophilic esophagitis - will need video visit or office visit to discuss it and start treatment

## 2021-11-29 ENCOUNTER — Telehealth (INDEPENDENT_AMBULATORY_CARE_PROVIDER_SITE_OTHER): Payer: BC Managed Care – PPO | Admitting: Gastroenterology

## 2021-11-29 DIAGNOSIS — K200 Eosinophilic esophagitis: Secondary | ICD-10-CM | POA: Diagnosis not present

## 2021-11-29 MED ORDER — OMEPRAZOLE 40 MG PO CPDR: 40.0000 mg | DELAYED_RELEASE_CAPSULE | Freq: Every day | ORAL | 3 refills | Status: DC

## 2021-11-29 MED ORDER — FLUTICASONE PROPIONATE (INHAL) 250 MCG/ACT IN AEPB: INHALATION_SPRAY | RESPIRATORY_TRACT | 1 refills | Status: DC

## 2021-11-29 MED ORDER — OMEPRAZOLE 40 MG PO CPDR: 40.0000 mg | DELAYED_RELEASE_CAPSULE | Freq: Every day | ORAL | 1 refills | Status: DC

## 2021-11-29 NOTE — Progress Notes (Signed)
Charles Romero , MD [address]  [place]  [place], [place] [address]  Main: [phone]  Fax: [phone]   Primary Care Physician: Jerrol Banana., MD  Virtual Visit via Video Note  I connected with patient on 11/29/21 at 11:30 AM EDT by video and verified that I am speaking with the correct person using two identifiers.   I discussed the limitations, risks, security and privacy concerns of performing an evaluation and management service by video  and the availability of in person appointments. I also discussed with the patient that there may be a patient responsible charge related to this service. The patient expressed understanding and agreed to proceed.  Location of Patient: Home Location of Provider: Home Persons involved: Patient and provider only   History of Present Illness: Chief Complaint  Patient presents with   Follow-up    Go over results    HPI: Charles Romero is a 61 y.o. male    Summary of history :  Initially referred and seen on 10/25/2021 for dysphagia. He states that he had to 3 episodes of food it felt like to get stuck in his throat.  Subsequently was started on a course of Prilosec and all discomfort associated with swallowing resolved completely absolutely no issues after that.  Denies any heartburn denies any weight loss not a smoker. Performed his colonoscopy in 05/21/2019 and he had 3 tubular adenomas which were resected due for a repeat colonoscopy in 05/20/2022    Interval history   10/25/2021-11/29/2021  11/27/2021: EGD: Stricture at GE junction dilated to 18 mm and biopsies of the esophagus taken showed > 75 eosinophils PHFP.    He is feeling much better after dilation no difficulties with swallowing.    Current Outpatient Medications  Medication Sig Dispense Refill   latanoprost (XALATAN) 0.005 % ophthalmic solution 1 drop at bedtime.     levothyroxine (SYNTHROID) 100 MCG tablet TAKE 1 TABLET EVERY DAY ON EMPTY STOMACHWITH  A GLASS OF WATER AT LEAST 30-60 MINBEFORE BREAKFAST 90 tablet 0   MULTIPLE VITAMIN PO Take by mouth.     OMEGA-3 FATTY ACIDS PO Take by mouth.     omeprazole (PRILOSEC) 40 MG capsule Take 1 capsule (40 mg total) by mouth daily. 90 capsule 3   zolpidem (AMBIEN) 10 MG tablet TAKE ONE TABLET AT BEDTIME IF NEEDED FORSLEEP 30 tablet 3   No current facility-administered medications for this visit.    Allergies as of 11/29/2021   (No Known Allergies)    Review of Systems:    All systems reviewed and negative except where noted in HPI.  General Appearance:    Alert, cooperative, no distress, appears stated age  Head:    Normocephalic, without obvious abnormality, atraumatic  Eyes:    PERRL, conjunctiva/corneas clear,  Ears:    Grossly normal hearing    Neurologic:  Grossly normal    Observations/Objective:  Labs: CMP     Component Value Date/Time   NA 140 04/23/2021 1034   K 5.1 04/23/2021 1034   CL 101 04/23/2021 1034   CO2 24 04/23/2021 1034   GLUCOSE 98 04/23/2021 1034   GLUCOSE 94 03/26/2017 0814   BUN 16 04/23/2021 1034   CREATININE 1.06 04/23/2021 1034   CREATININE 0.99 03/26/2017 0814   CALCIUM 9.6 04/23/2021 1034   PROT 7.0 04/23/2021 1034   ALBUMIN 4.9 (H) 04/23/2021 1034   AST 30 04/23/2021 1034   ALT 43 04/23/2021 1034   ALKPHOS 63  04/23/2021 1034   BILITOT 0.6 04/23/2021 1034   GFRNONAA 93 04/03/2020 1132   GFRAA 108 04/03/2020 1132   Lab Results  Component Value Date   WBC 6.3 04/23/2021   HGB 16.1 04/23/2021   HCT 49.0 04/23/2021   MCV 90 04/23/2021   PLT 371 04/23/2021    Imaging Studies: No results found.  Assessment and Plan:   Charles Romero is a 61 y.o. y/o male who is here today to discuss the results of his recent endoscopy which was performed for dysphagia and a stricture at the GE junction was dilated to 18 mm.  Biopsies of the esophagus confirm the diagnosis of eosinophilic esophagitis.  He is symptomatically feeling better after his  dilation.  Plan 1.  Commence on omeprazole 40 mg once a day till next office visit in 4 months 2.  Fluticasone inhaler inhaler technique demonstrated to be used for 8 weeks for treatment of eosinophilic esophagitis 3.  Food allergy testing 4.  If symptoms recur can consider Dupixent        I discussed the assessment and treatment plan with the patient. The patient was provided an opportunity to ask questions and all were answered. The patient agreed with the plan and demonstrated an understanding of the instructions.   The patient was advised to call back or seek an in-person evaluation if the symptoms worsen or if the condition fails to improve as anticipated.  I provided 18 minutes of face-to-face time during this encounter.  Dr Charles Bellows MD,MRCP [facility]) Gastroenterology/Hepatology Pager: [phone]   Speech recognition software was used to dictate this note.

## 2021-11-29 NOTE — Addendum Note (Signed)
Addended by: Wayna Chalet on: 11/29/2021 02:54 PM   Modules accepted: Orders

## 2021-12-10 DIAGNOSIS — K200 Eosinophilic esophagitis: Secondary | ICD-10-CM | POA: Diagnosis not present

## 2021-12-10 MED ORDER — FLOVENT HFA 220 MCG/ACT IN AERO: INHALATION_SPRAY | RESPIRATORY_TRACT | 1 refills | Status: DC

## 2021-12-10 NOTE — Addendum Note (Signed)
Addended by: Wayna Chalet on: 12/10/2021 09:24 AM   Modules accepted: Orders

## 2021-12-13 LAB — FOOD ALLERGY PROFILE
Allergen Corn, IgE: <0.1 kU/L
Clam IgE: <0.1 kU/L
Codfish IgE: <0.1 kU/L
Egg White IgE: <0.1 kU/L
Milk IgE: <0.1 kU/L
Peanut IgE: <0.1 kU/L
Scallop IgE: <0.1 kU/L
Sesame Seed IgE: 0.19 kU/L — AB
Shrimp IgE: <0.1 kU/L
Soybean IgE: <0.1 kU/L
Walnut IgE: <0.1 kU/L
Wheat IgE: <0.1 kU/L

## 2021-12-14 ENCOUNTER — Other Ambulatory Visit: Payer: Self-pay | Admitting: Gastroenterology

## 2021-12-14 NOTE — Progress Notes (Signed)
Very mild possioble allergy to sesame seeds, rest is negative

## 2021-12-19 ENCOUNTER — Telehealth: Payer: Self-pay

## 2021-12-19 NOTE — Telephone Encounter (Signed)
Called patient but was not able to reach him. Therefore, I left him a detailed message letting him know the below information.

## 2021-12-19 NOTE — Telephone Encounter (Signed)
-----   Message from Jonathon Bellows, MD sent at 12/14/2021  8:27 AM EDT ----- Very mild possioble allergy to sesame seeds, rest is negative

## 2022-03-05 ENCOUNTER — Telehealth (INDEPENDENT_AMBULATORY_CARE_PROVIDER_SITE_OTHER): Payer: BC Managed Care – PPO | Admitting: Gastroenterology

## 2022-03-05 DIAGNOSIS — K200 Eosinophilic esophagitis: Secondary | ICD-10-CM

## 2022-03-05 NOTE — Progress Notes (Signed)
Charles Romero , MD [address]  [place]  [place],  [address]  Main: [phone]  Fax: [phone]   Primary Care Physician: Jerrol Banana., MD  Virtual Visit via Video Note  I connected with patient on 03/05/22 at  1:15 PM EDT by video and verified that I am speaking with the correct person using two identifiers.   I discussed the limitations, risks, security and privacy concerns of performing an evaluation and management service by video  and the availability of in person appointments. I also discussed with the patient that there may be a patient responsible charge related to this service. The patient expressed understanding and agreed to proceed.  Location of Patient: Home Location of Provider: Home Persons involved: Patient and provider only   History of Present Illness: Chief Complaint  Patient presents with   eosinophilic esophagitis    HPI: Charles Romero is a 60 y.o. male  Summary of history :   Initially referred and seen on 10/25/2021 for dysphagia. He states that he had to 3 episodes of food it felt like to get stuck in his throat.  Subsequently was started on a course of Prilosec and all discomfort associated with swallowing resolved completely absolutely no issues after that.  Denies any heartburn denies any weight loss not a smoker.  Performed his colonoscopy in 05/21/2019 and he had 3 tubular adenomas which were resected due for a repeat colonoscopy in 05/20/2022  11/27/2021: EGD: Stricture at GE junction dilated to 18 mm and biopsies of the esophagus taken showed > 75 eosinophils PHFP.     Interval history  11/29/2021-03/05/2022    12/10/2021: Food allergy profile was negative for allergies except a mild elevation of IgE for sesame seeds.  Commenced on fluticasone in June 2023 8 weeks.  He took a few puffs but was caught up with other issues and never completed 8-week course.  At this point of time he is taking his PPI daily has no issues  with dysphagia no symptoms no other discomfort and is happy with things   Current Outpatient Medications  Medication Sig Dispense Refill   FLOVENT HFA 220 MCG/ACT inhaler During a breath hold, place 2 puff(s) from a multidose inhaler into the mouth and swallow. Then brush your teeth. 1 each 1   latanoprost (XALATAN) 0.005 % ophthalmic solution 1 drop at bedtime.     levothyroxine (SYNTHROID) 100 MCG tablet TAKE 1 TABLET EVERY DAY ON EMPTY STOMACHWITH A GLASS OF WATER AT LEAST 30-60 MINBEFORE BREAKFAST 90 tablet 0   MULTIPLE VITAMIN PO Take by mouth.     OMEGA-3 FATTY ACIDS PO Take by mouth.     omeprazole (PRILOSEC) 40 MG capsule Take 1 capsule (40 mg total) by mouth daily. For 8 weeks only. 30 capsule 1   zolpidem (AMBIEN) 10 MG tablet TAKE ONE TABLET AT BEDTIME IF NEEDED FORSLEEP 30 tablet 3   No current facility-administered medications for this visit.    Allergies as of 03/05/2022   (No Known Allergies)    Review of Systems:    All systems reviewed and negative except where noted in HPI.  General Appearance:    Alert, cooperative, no distress, appears stated age  Head:    Normocephalic, without obvious abnormality, atraumatic  Eyes:    PERRL, conjunctiva/corneas clear,  Ears:    Grossly normal hearing    Neurologic:  Grossly normal    Observations/Objective:  Labs: CMP     Component Value Date/Time  NA 140 04/23/2021 1034   K 5.1 04/23/2021 1034   CL 101 04/23/2021 1034   CO2 24 04/23/2021 1034   GLUCOSE 98 04/23/2021 1034   GLUCOSE 94 03/26/2017 0814   BUN 16 04/23/2021 1034   CREATININE 1.06 04/23/2021 1034   CREATININE 0.99 03/26/2017 0814   CALCIUM 9.6 04/23/2021 1034   PROT 7.0 04/23/2021 1034   ALBUMIN 4.9 (H) 04/23/2021 1034   AST 30 04/23/2021 1034   ALT 43 04/23/2021 1034   ALKPHOS 63 04/23/2021 1034   BILITOT 0.6 04/23/2021 1034   GFRNONAA 93 04/03/2020 1132   GFRAA 108 04/03/2020 1132   Lab Results  Component Value Date   WBC 6.3 04/23/2021    HGB 16.1 04/23/2021   HCT 49.0 04/23/2021   MCV 90 04/23/2021   PLT 371 04/23/2021    Imaging Studies: No results found.  Assessment and Plan:   AAHIL FREDIN is a 61 y.o. y/o male is here today to discuss the results of his recent endoscopy which was performed for dysphagia and a stricture at the GE junction was dilated to 18 mm.  Biopsies of the esophagus confirm the diagnosis of eosinophilic esophagitis.  He is symptomatically feeling better after his dilation.  .  Food allergy testing only demonstrated elevation of IgE to sesame seeds.  Last visit he was given a course of fluticasone for 8 weeks he took it for a day or 2 but did not pursue it as he was concerned whether he may get a thrush infection.  He is taking his PPI and presently has no symptoms whatsoever  Plan 1.  Since he is asymptomatic continue PPI and continue at 20 mg twice a day.  We will reevaluate in 1 years time if he needs to change therapy or reduce the dose of PPI if possible.  I am reluctant to reduce the dose of PPI right now as he had a recurrence of dysphagia.  Options down the road include Dupixent.  Discussed his food allergy profile unclear if its clinical significance he has not found any issues when eating sesame       I discussed the assessment and treatment plan with the patient. The patient was provided an opportunity to ask questions and all were answered. The patient agreed with the plan and demonstrated an understanding of the instructions.   The patient was advised to call back or seek an in-person evaluation if the symptoms worsen or if the condition fails to improve as anticipated.  I provided 20 minutes of face-to-face time during this encounter.  Dr Charles Bellows MD,MRCP [facility]) Gastroenterology/Hepatology Pager: [phone]   Speech recognition software was used to dictate this note.

## 2022-03-27 ENCOUNTER — Ambulatory Visit (INDEPENDENT_AMBULATORY_CARE_PROVIDER_SITE_OTHER): Payer: BC Managed Care – PPO | Admitting: Dermatology

## 2022-03-27 ENCOUNTER — Encounter: Payer: Self-pay | Admitting: Dermatology

## 2022-03-27 DIAGNOSIS — Z85828 Personal history of other malignant neoplasm of skin: Secondary | ICD-10-CM

## 2022-03-27 DIAGNOSIS — Z79899 Other long term (current) drug therapy: Secondary | ICD-10-CM

## 2022-03-27 DIAGNOSIS — B078 Other viral warts: Secondary | ICD-10-CM

## 2022-03-27 DIAGNOSIS — L570 Actinic keratosis: Secondary | ICD-10-CM | POA: Diagnosis not present

## 2022-03-27 DIAGNOSIS — Z1283 Encounter for screening for malignant neoplasm of skin: Secondary | ICD-10-CM | POA: Diagnosis not present

## 2022-03-27 DIAGNOSIS — L814 Other melanin hyperpigmentation: Secondary | ICD-10-CM | POA: Diagnosis not present

## 2022-03-27 DIAGNOSIS — L578 Other skin changes due to chronic exposure to nonionizing radiation: Secondary | ICD-10-CM

## 2022-03-27 DIAGNOSIS — D229 Melanocytic nevi, unspecified: Secondary | ICD-10-CM

## 2022-03-27 DIAGNOSIS — Z86018 Personal history of other benign neoplasm: Secondary | ICD-10-CM

## 2022-03-27 DIAGNOSIS — D1801 Hemangioma of skin and subcutaneous tissue: Secondary | ICD-10-CM

## 2022-03-27 DIAGNOSIS — L821 Other seborrheic keratosis: Secondary | ICD-10-CM

## 2022-03-27 DIAGNOSIS — D239 Other benign neoplasm of skin, unspecified: Secondary | ICD-10-CM

## 2022-03-27 DIAGNOSIS — Z5111 Encounter for antineoplastic chemotherapy: Secondary | ICD-10-CM

## 2022-03-27 NOTE — Patient Instructions (Signed)
Instructions for [facility] Medications  One or more of your medications was sent to the [facility] mail order compounding pharmacy. You will receive an email from them and can purchase the medicine through that link. It will then be mailed to your home at the address you confirmed. If for any reason you do not receive an email from them, please check your spam folder. If you still do not find the email, please let us know. [facility] phone number is [phone].    Cryotherapy Aftercare  Wash gently with soap and water everyday.   Apply Vaseline and Band-Aid daily until healed.   Due to recent changes in healthcare laws, you may see results of your pathology and/or laboratory studies on MyChart before the doctors have had a chance to review them. We understand that in some cases there may be results that are confusing or concerning to you. Please understand that not all results are received at the same time and often the doctors may need to interpret multiple results in order to provide you with the best plan of care or course of treatment. Therefore, we ask that you please give us 2 business days to thoroughly review all your results before contacting the office for clarification. Should we see a critical lab result, you will be contacted sooner.   If You Need Anything After Your Visit  If you have any questions or concerns for your doctor, please call our main line at [phone] and press option 4 to reach your doctor's medical assistant. If no one answers, please leave a voicemail as directed and we will return your call as soon as possible. Messages left after 4 pm will be answered the following business day.   You may also send us a message via MyChart. We typically respond to MyChart messages within 1-2 business days.  For prescription refills, please ask your pharmacy to contact our office. Our fax number is [phone].  If you have an urgent issue when the clinic is  closed that cannot wait until the next business day, you can page your doctor at the number below.    Please note that while we do our best to be available for urgent issues outside of office hours, we are not available 24/7.   If you have an urgent issue and are unable to reach us, you may choose to seek medical care at your doctor's office, retail clinic, urgent care center, or emergency room.  If you have a medical emergency, please immediately call 911 or go to the emergency department.  Pager Numbers  - Dr. Kowalski: [phone]  - Dr. Moye: [phone]  - Dr. Stewart: [phone]  In the event of inclement weather, please call our main line at [phone] for an update on the status of any delays or closures.  Dermatology Medication Tips: Please keep the boxes that topical medications come in in order to help keep track of the instructions about where and how to use these. Pharmacies typically print the medication instructions only on the boxes and not directly on the medication tubes.   If your medication is too expensive, please contact our office at [phone] option 4 or send us a message through MyChart.   We are unable to tell what your co-pay for medications will be in advance as this is different depending on your insurance coverage. However, we may be able to find a substitute medication at lower cost or fill out paperwork to get insurance to cover a   needed medication.   If a prior authorization is required to get your medication covered by your insurance company, please allow us 1-2 business days to complete this process.  Drug prices often vary depending on where the prescription is filled and some pharmacies may offer cheaper prices.  The website www.goodrx.com contains coupons for medications through different pharmacies. The prices here do not account for what the cost may be with help from insurance (it may be cheaper with your insurance), but the website can  give you the price if you did not use any insurance.  - You can print the associated coupon and take it with your prescription to the pharmacy.  - You may also stop by our office during regular business hours and pick up a GoodRx coupon card.  - If you need your prescription sent electronically to a different pharmacy, notify our office through [facility] MyChart or by phone at [phone] option 4.     Si Usted Necesita Algo Despus de Su Visita  Tambin puede enviarnos un mensaje a travs de MyChart. Por lo general respondemos a los mensajes de MyChart en el transcurso de 1 a 2 das hbiles.  Para renovar recetas, por favor pida a su farmacia que se ponga en contacto con nuestra oficina. Nuestro nmero de fax es el [phone].  Si tiene un asunto urgente cuando la clnica est cerrada y que no puede esperar hasta el siguiente da hbil, puede llamar/localizar a su doctor(a) al nmero que aparece a continuacin.   Por favor, tenga en cuenta que aunque hacemos todo lo posible para estar disponibles para asuntos urgentes fuera del horario de oficina, no estamos disponibles las 24 horas del da, los 7 das de la semana.   Si tiene un problema urgente y no puede comunicarse con nosotros, puede optar por buscar atencin mdica  en el consultorio de su doctor(a), en una clnica privada, en un centro de atencin urgente o en una sala de emergencias.  Si tiene una emergencia mdica, por favor llame inmediatamente al 911 o vaya a la sala de emergencias.  Nmeros de bper  - Dr. Kowalski: [phone]  - Dra. Moye: [phone]  - Dra. Stewart: [phone]  En caso de inclemencias del tiempo, por favor llame a nuestra lnea principal al [phone] para una actualizacin sobre el estado de cualquier retraso o cierre.  Consejos para la medicacin en dermatologa: Por favor, guarde las cajas en las que vienen los medicamentos de uso tpico para ayudarle a seguir las instrucciones sobre  dnde y cmo usarlos. Las farmacias generalmente imprimen las instrucciones del medicamento slo en las cajas y no directamente en los tubos del medicamento.   Si su medicamento es muy caro, por favor, pngase en contacto con nuestra oficina llamando al [phone] y presione la opcin 4 o envenos un mensaje a travs de MyChart.   No podemos decirle cul ser su copago por los medicamentos por adelantado ya que esto es diferente dependiendo de la cobertura de su seguro. Sin embargo, es posible que podamos encontrar un medicamento sustituto a menor costo o llenar un formulario para que el seguro cubra el medicamento que se considera necesario.   Si se requiere una autorizacin previa para que su compaa de seguros cubra su medicamento, por favor permtanos de 1 a 2 das hbiles para completar este proceso.  Los precios de los medicamentos varan con frecuencia dependiendo del lugar de dnde se surte la receta y alguna farmacias pueden ofrecer precios ms baratos.    El sitio web www.goodrx.com tiene cupones para medicamentos de diferentes farmacias. Los precios aqu no tienen en cuenta lo que podra costar con la ayuda del seguro (puede ser ms barato con su seguro), pero el sitio web puede darle el precio si no utiliz ningn seguro.  - Puede imprimir el cupn correspondiente y llevarlo con su receta a la farmacia.  - Tambin puede pasar por nuestra oficina durante el horario de atencin regular y recoger una tarjeta de cupones de GoodRx.  - Si necesita que su receta se enve electrnicamente a una farmacia diferente, informe a nuestra oficina a travs de MyChart de [place] o por telfono llamando al [phone] y presione la opcin 4.  

## 2022-03-27 NOTE — Progress Notes (Signed)
Follow-Up Visit   Subjective  Charles Romero is a 61 y.o. male who presents for the following: Annual Exam (History of BCC and dysplastic nevi - The patient presents for Total-Body Skin Exam (TBSE) for skin cancer screening and mole check.  The patient has spots, moles and lesions to be evaluated, some may be new or changing and the patient has concerns that these could be cancer./).  The following portions of the chart were reviewed this encounter and updated as appropriate:   Tobacco  Allergies  Meds  Problems  Med Hx  Surg Hx  Fam Hx     Review of Systems:  No other skin or systemic complaints except as noted in HPI or Assessment and Plan.  Objective  Well appearing patient in no apparent distress; mood and affect are within normal limits.  A full examination was performed including scalp, head, eyes, ears, nose, lips, neck, chest, axillae, abdomen, back, buttocks, bilateral upper extremities, bilateral lower extremities, hands, feet, fingers, toes, fingernails, and toenails. All findings within normal limits unless otherwise noted below.  Scalp (7) Erythematous thin papules/macules with gritty scale.   Left Distal 2nd Finger Verrucous papules -- Discussed viral etiology and contagion.    Assessment & Plan   History of Basal Cell Carcinoma of the Skin - No evidence of recurrence today - Recommend regular full body skin exams - Recommend daily broad spectrum sunscreen SPF 30+ to sun-exposed areas, reapply every 2 hours as needed.  - Call if any new or changing lesions are noted between office visits  History of Dysplastic Nevi - No evidence of recurrence today - Recommend regular full body skin exams - Recommend daily broad spectrum sunscreen SPF 30+ to sun-exposed areas, reapply every 2 hours as needed.  - Call if any new or changing lesions are noted between office visits  Lentigines - Scattered tan macules - Due to sun exposure - Benign-appearing, observe -  Recommend daily broad spectrum sunscreen SPF 30+ to sun-exposed areas, reapply every 2 hours as needed. - Call for any changes  Seborrheic Keratoses - Stuck-on, waxy, tan-brown papules and/or plaques  - Benign-appearing - Discussed benign etiology and prognosis. - Observe - Call for any changes  Melanocytic Nevi - Tan-brown and/or pink-flesh-colored symmetric macules and papules - Benign appearing on exam today - Observation - Call clinic for new or changing moles - Recommend daily use of broad spectrum spf 30+ sunscreen to sun-exposed areas.   Hemangiomas - Red papules - Discussed benign nature - Observe - Call for any changes  Skin cancer screening performed today.  AK (actinic keratosis) (7) Scalp Actinic Damage - Severe, confluent actinic changes with pre-cancerous actinic keratoses  - Severe, chronic, not at goal, secondary to cumulative UV radiation exposure over time - diffuse scaly erythematous macules and papules with underlying dyspigmentation - Discussed Prescription "Field Treatment" for Severe, Chronic Confluent Actinic Changes with Pre-Cancerous Actinic Keratoses Field treatment involves treatment of an entire area of skin that has confluent Actinic Changes (Sun/ Ultraviolet light damage) and PreCancerous Actinic Keratoses by method of PhotoDynamic Therapy (PDT) and/or prescription Topical Chemotherapy agents such as 5-fluorouracil, 5-fluorouracil/calcipotriene, and/or imiquimod.  The purpose is to decrease the number of clinically evident and subclinical PreCancerous lesions to prevent progression to development of skin cancer by chemically destroying early precancer changes that may or may not be visible.  It has been shown to reduce the risk of developing skin cancer in the treated area. As a result of treatment, redness, scaling,  crusting, and open sores may occur during treatment course. One or more than one of these methods may be used and may have to be used  several times to control, suppress and eliminate the PreCancerous changes. Discussed treatment course, expected reaction, and possible side effects. - Recommend daily broad spectrum sunscreen SPF 30+ to sun-exposed areas, reapply every 2 hours as needed.  - Staying in the shade or wearing long sleeves, sun glasses (UVA+UVB protection) and wide brim hats (4-inch brim around the entire circumference of the hat) are also recommended. - Call for new or changing lesions.  - Start 5-fluorouracil/calcipotriene cream twice a day for 7 days to affected areas including scalp. Prescription sent to [facility]. Patient advised they will receive an email to purchase the medication online and have it sent to their home. Patient provided with handout reviewing treatment course and side effects and advised to call or message [place] on MyChart with any concerns.   Destruction of lesion - Scalp Complexity: simple   Destruction method: cryotherapy   Informed consent: discussed and consent obtained   Timeout:  patient name, date of birth, surgical site, and procedure verified Lesion destroyed using liquid nitrogen: Yes   Region frozen until ice ball extended beyond lesion: Yes   Outcome: patient tolerated procedure well with no complications   Post-procedure details: wound care instructions given    Other viral warts Left Distal 2nd Finger Viral wart vs Inflamed SK Destruction of lesion - Left Distal 2nd Finger Complexity: simple   Destruction method: cryotherapy   Informed consent: discussed and consent obtained   Timeout:  patient name, date of birth, surgical site, and procedure verified Lesion destroyed using liquid nitrogen: Yes   Region frozen until ice ball extended beyond lesion: Yes   Outcome: patient tolerated procedure well with no complications   Post-procedure details: wound care instructions given    Skin cancer screening  Return in about 6 months (around 09/25/2022) for  UBSE.  I, Ashok Cordia, CMA, am acting as scribe for Sarina Ser, MD . Documentation: I have reviewed the above documentation for accuracy and completeness, and I agree with the above.  Sarina Ser, MD

## 2022-04-24 ENCOUNTER — Encounter: Payer: BC Managed Care – PPO | Admitting: Family Medicine

## 2022-04-25 ENCOUNTER — Ambulatory Visit (INDEPENDENT_AMBULATORY_CARE_PROVIDER_SITE_OTHER): Payer: BC Managed Care – PPO | Admitting: Physician Assistant

## 2022-04-25 ENCOUNTER — Encounter: Payer: Self-pay | Admitting: Physician Assistant

## 2022-04-25 VITALS — BP 108/70 | HR 59 | Ht 72.0 in | Wt 185.8 lb

## 2022-04-25 DIAGNOSIS — Z23 Encounter for immunization: Secondary | ICD-10-CM | POA: Diagnosis not present

## 2022-04-25 DIAGNOSIS — E039 Hypothyroidism, unspecified: Secondary | ICD-10-CM | POA: Diagnosis not present

## 2022-04-25 DIAGNOSIS — Z125 Encounter for screening for malignant neoplasm of prostate: Secondary | ICD-10-CM | POA: Diagnosis not present

## 2022-04-25 DIAGNOSIS — E7800 Pure hypercholesterolemia, unspecified: Secondary | ICD-10-CM | POA: Diagnosis not present

## 2022-04-25 DIAGNOSIS — G4700 Insomnia, unspecified: Secondary | ICD-10-CM

## 2022-04-25 DIAGNOSIS — Z1211 Encounter for screening for malignant neoplasm of colon: Secondary | ICD-10-CM

## 2022-04-25 DIAGNOSIS — Z0000 Encounter for general adult medical examination without abnormal findings: Secondary | ICD-10-CM | POA: Diagnosis not present

## 2022-04-25 MED ORDER — ZOLPIDEM TARTRATE 10 MG PO TABS: ORAL_TABLET | ORAL | 3 refills | Status: AC

## 2022-04-25 NOTE — Assessment & Plan Note (Signed)
Will repeat tsh/t4 for stability

## 2022-04-25 NOTE — Progress Notes (Signed)
I,Sha'taria Tyson,acting as a Education administrator for [employer], PA-C.,have documented all relevant documentation on the behalf of Charles Kirschner, PA-C,as directed by  Charles Kirschner, PA-C while in the presence of Charles Kirschner, PA-C.   Complete physical exam   Patient: Charles Romero   DOB: 03-10-1961   61 y.o. Male  MRN: [id number] Visit Date: 04/25/2022  Today's healthcare provider: [name], PA-C   Cc. Cpe   Subjective    Charles Romero is a 61 y.o. male who presents today for a complete physical exam.  He reports consuming a general diet.  The patient reports rotating between running, calisthenics, strength training for at least one hour six days a week.  He generally feels well. He reports sleeping fairly well. He does not have additional problems to discuss today.  HPI    Past Medical History:  Diagnosis Date   Allergy    Cats   Basal cell carcinoma 12/07/2013   R mid dorsum forearm - superficial    Basal cell carcinoma 03/17/2017   L prox dorsum forearm at the elbow near the antecubital - superficial    Cataract 2022   Dysplastic nevus 07/25/2008   L epigastric - moderate   Dysplastic nevus 07/25/2008   upper back right of spine - moderate   Dysplastic nevus 12/15/2008   R buttocks sup lat - mild to moderate   Dysplastic nevus 06/08/2009   R low back medial - mild    Dysplastic nevus 06/08/2009   R low back lat - mild to moderate   Dysplastic nevus 06/08/2009   L mid back paraspinal - mild   Dysplastic nevus 06/08/2009   R epigastric next to midline - severe, excision 08/23/2009   Dysplastic nevus 12/18/2009   LUQA    Dysplastic nevus 12/18/2009   L deltoid    Dysplastic nevus 12/07/2013   R parallel to umbilicus lat abdomen - mild    Dysplastic nevus 04/20/2018   R lower back - moderate   Dysplastic nevus 05/17/2019   R post shoulder at the lat sup scapula - moderate   Glaucoma 2022   Hypothyroidism    Past Surgical History:  Procedure Laterality  Date   COLONOSCOPY WITH PROPOFOL N/A 05/19/2019   Procedure: COLONOSCOPY WITH PROPOFOL;  Surgeon: Jonathon Bellows, MD;  Location: [facility] ENDOSCOPY;  Service: Gastroenterology;  Laterality: N/A;   ESOPHAGOGASTRODUODENOSCOPY (EGD) WITH PROPOFOL N/A 11/27/2021   Procedure: ESOPHAGOGASTRODUODENOSCOPY (EGD) WITH PROPOFOL;  Surgeon: Jonathon Bellows, MD;  Location: [facility] ENDOSCOPY;  Service: Gastroenterology;  Laterality: N/A;   FRACTURE SURGERY  1977   Collarbone   HEMORRHOID SURGERY     HERNIA REPAIR     ROTATOR CUFF REPAIR     SHOULDER ARTHROSCOPY Left 09/04/2017   rotator cuff repair, torn labrium, bicep and rotator   VASECTOMY     Social History   Socioeconomic History   Marital status: Married    Spouse name: Not on file   Number of children: Not on file   Years of education: Not on file   Highest education level: Not on file  Occupational History   Not on file  Tobacco Use   Smoking status: Never   Smokeless tobacco: Never  Vaping Use   Vaping Use: Never used  Substance and Sexual Activity   Alcohol use: Yes    Alcohol/week: 6.0 standard drinks of alcohol    Types: 2 Glasses of wine, 4 Cans of beer per week    Comment: 3 drinks a  week   Drug use: No   Sexual activity: Yes    Birth control/protection: Surgical    Comment: Monogamous  Other Topics Concern   Not on file  Social History Narrative   Not on file   Social Determinants of Health   Financial Resource Strain: Not on file  Food Insecurity: Not on file  Transportation Needs: Not on file  Physical Activity: Not on file  Stress: Not on file  Social Connections: Not on file  Intimate Partner Violence: Not on file   Family Status  Relation Name Status   Mother Alyce Deceased   Father Sharen Heck Alive   Brother PRESTON GARABEDIAN Alive   Brother  Alive   Family History  Problem Relation Age of Onset   Arthritis Mother    Glaucoma Mother    Colon polyps Father    Diabetes Father    Cancer Brother    Kidney cancer  Brother    No Known Allergies  Patient Care Team: Jerrol Banana., MD as PCP - General (Family Medicine)   Medications: Outpatient Medications Prior to Visit  Medication Sig   FLOVENT HFA 220 MCG/ACT inhaler During a breath hold, place 2 puff(s) from a multidose inhaler into the mouth and swallow. Then brush your teeth.   latanoprost (XALATAN) 0.005 % ophthalmic solution 1 drop at bedtime.   levothyroxine (SYNTHROID) 100 MCG tablet TAKE 1 TABLET EVERY DAY ON EMPTY STOMACHWITH A GLASS OF WATER AT LEAST 30-60 MINBEFORE BREAKFAST   MULTIPLE VITAMIN PO Take by mouth.   OMEGA-3 FATTY ACIDS PO Take by mouth.   omeprazole (PRILOSEC) 40 MG capsule Take 1 capsule (40 mg total) by mouth daily. For 8 weeks only.   [DISCONTINUED] zolpidem (AMBIEN) 10 MG tablet TAKE ONE TABLET AT BEDTIME IF NEEDED FORSLEEP   No facility-administered medications prior to visit.    Review of Systems  Constitutional:  Negative for fatigue and fever.  Respiratory:  Negative for cough and shortness of breath.   Cardiovascular:  Negative for chest pain, palpitations and leg swelling.  Genitourinary:  Positive for frequency.  Neurological:  Negative for dizziness and headaches.  Psychiatric/Behavioral:  Positive for sleep disturbance.      Objective    Blood pressure 108/70, pulse (!) 59, height '5\' 5"'$  (1.651 m), weight 185 lb 12.8 oz (84.3 kg), SpO2 98 %.    Physical Exam Constitutional:      General: He is awake.     Appearance: He is well-developed.  HENT:     Head: Normocephalic.     Right Ear: Tympanic membrane, ear canal and external ear normal.     Left Ear: Tympanic membrane, ear canal and external ear normal.     Nose: Nose normal. No congestion or rhinorrhea.     Mouth/Throat:     Mouth: Mucous membranes are moist.     Pharynx: No oropharyngeal exudate or posterior oropharyngeal erythema.  Eyes:     Pupils: Pupils are equal, round, and reactive to light.  Cardiovascular:     Rate and  Rhythm: Normal rate and regular rhythm.     Heart sounds: Normal heart sounds.  Pulmonary:     Effort: Pulmonary effort is normal.     Breath sounds: Normal breath sounds.  Abdominal:     General: There is no distension.     Palpations: Abdomen is soft.     Tenderness: There is no abdominal tenderness. There is no guarding.  Musculoskeletal:     Cervical  back: Normal range of motion.     Right lower leg: No edema.     Left lower leg: No edema.  Lymphadenopathy:     Cervical: No cervical adenopathy.  Skin:    General: Skin is warm.  Neurological:     Mental Status: He is alert and oriented to person, place, and time.  Psychiatric:        Attention and Perception: Attention normal.        Mood and Affect: Mood normal.        Speech: Speech normal.        Behavior: Behavior normal. Behavior is cooperative.     Last depression screening scores    04/25/2022    2:13 PM 04/23/2021    9:22 AM 07/12/2020    2:04 PM  PHQ 2/9 Scores  PHQ - 2 Score 0 0 0  PHQ- 9 Score 1 1 0   Last fall risk screening    04/25/2022    2:13 PM  Fall Risk   Falls in the past year? 0  Number falls in past yr: 0  Injury with Fall? 0  Risk for fall due to : No Fall Risks  Follow up Falls evaluation completed   Last Audit-C alcohol use screening    04/25/2022    2:12 PM  Alcohol Use Disorder Test (AUDIT)  1. How often do you have a drink containing alcohol? 0  2. How many drinks containing alcohol do you have on a typical day when you are drinking? 0  3. How often do you have six or more drinks on one occasion? 0  AUDIT-C Score 0   A score of 3 or more in women, and 4 or more in men indicates increased risk for alcohol abuse, EXCEPT if all of the points are from question 1   No results found for any visits on 04/25/22.  Assessment & Plan    Routine Health Maintenance and Physical Exam  Exercise Activities and Dietary recommendations --balanced diet high in fiber and protein, low in  sugars, carbs, fats. --physical activity/exercise 30 minutes 3-5 times a week    Immunization History  Administered Date(s) Administered   Hepatitis A 12/25/2004, 03/03/2013   Influenza Split 03/26/2011   Influenza,inj,Quad PF,6+ Mos 03/23/2014, 03/28/2015, 03/28/2016, 03/05/2017, 03/10/2018, 03/16/2019, 04/03/2020, 04/23/2021, 04/25/2022   Td 10/04/2004   Tdap 03/03/2013    Health Maintenance  Topic Date Due   COVID-19 Vaccine (1) Never done   Zoster Vaccines- Shingrix (1 of 2) 07/26/2022 (Originally 09/09/1978)   HIV Screening  04/26/2023 (Originally [birth year]1974)   COLONOSCOPY (Pts 45-53yr Insurance coverage will need to be confirmed)  05/18/2022   TETANUS/TDAP  03/04/2023   INFLUENZA VACCINE  Completed   Hepatitis C Screening  Completed   HPV VACCINES  Aged Out    Discussed health benefits of physical activity, and encouraged him to engage in regular exercise appropriate for his age and condition.  Problem List Items Addressed This Visit       Endocrine   Adult hypothyroidism    Will repeat tsh/t4 for stability      Relevant Orders   TSH + free T4     Other   Hypercholesteremia    Not on statin will continue to monitor Ordered fasting lipids/cmp The 10-year ASCVD risk score (Arnett DK, et al., 2019) is: 7%       Relevant Orders   Comprehensive Metabolic Panel (CMET)   Lipid panel   Insomnia  Pt uses ambien 1/4-1/2 tab PRN Advised trying magnesium glycinate PRN QHS, no more than 400 mg magnesium daily. Ok to continue to use ambien PRN, #30 lasts several months.       Relevant Medications   zolpidem (AMBIEN) 10 MG tablet   Other Visit Diagnoses     Annual physical exam    -  Primary   Relevant Orders   CBC w/Diff/Platelet   Prostate cancer screening       Relevant Orders   PSA   Colon cancer screening       Relevant Orders   Ambulatory referral to Gastroenterology   Need for immunization against influenza       Relevant Orders   Flu Vaccine QUAD  3moIM (Fluarix, Fluzone & Alfiuria Quad PF) (Completed)        Return in about 1 year (around 04/26/2023) for CPE.     I, LMikey Kirschner PA-C have reviewed all documentation for this visit. The documentation on  04/25/2022 for the exam, diagnosis, procedures, and orders are all accurate and complete.  LMikey Kirschner PA-C [facility] [place] [address]Office: [phone]: [phone]

## 2022-04-25 NOTE — Assessment & Plan Note (Signed)
Pt uses ambien 1/4-1/2 tab PRN Advised trying magnesium glycinate PRN QHS, no more than 400 mg magnesium daily. Ok to continue to use ambien PRN, #30 lasts several months.

## 2022-04-25 NOTE — Patient Instructions (Addendum)
Magnesium Glycinate  No more than 400 mg a day

## 2022-04-25 NOTE — Assessment & Plan Note (Signed)
Not on statin will continue to monitor Ordered fasting lipids/cmp The 10-year ASCVD risk score (Arnett DK, et al., 2019) is: 7%

## 2022-04-26 ENCOUNTER — Encounter: Payer: Self-pay | Admitting: Physician Assistant

## 2022-04-26 ENCOUNTER — Telehealth: Payer: Self-pay

## 2022-04-26 DIAGNOSIS — Z0000 Encounter for general adult medical examination without abnormal findings: Secondary | ICD-10-CM | POA: Diagnosis not present

## 2022-04-26 DIAGNOSIS — Z125 Encounter for screening for malignant neoplasm of prostate: Secondary | ICD-10-CM | POA: Diagnosis not present

## 2022-04-26 DIAGNOSIS — E7800 Pure hypercholesterolemia, unspecified: Secondary | ICD-10-CM | POA: Diagnosis not present

## 2022-04-26 DIAGNOSIS — E039 Hypothyroidism, unspecified: Secondary | ICD-10-CM | POA: Diagnosis not present

## 2022-04-26 NOTE — Telephone Encounter (Signed)
Can you do an addendum to yesterdays visit and make this correction? Thanks    Copied from [place] [phone]. Topic: General - Other >> Apr 26, 2022 11:28 AM Everette C wrote: Reason for CRM: The patient has called to share that they are 6 ft tall and way 185 lbs  The patient shares that they are not 5 ft 5 in tall and would like this corrected so that their chart no longer indicates that they are borderline obese   The patient would like to be notified by a member of clinical staff when this correction has been made   Please contact further when possible

## 2022-04-27 LAB — TSH+FREE T4
Free T4: 1.4 ng/dL (ref 0.82–1.77)
TSH: 4.92 u[IU]/mL — ABNORMAL HIGH (ref 0.450–4.500)

## 2022-04-27 LAB — COMPREHENSIVE METABOLIC PANEL
ALT: 26 IU/L (ref 0–44)
AST: 25 IU/L (ref 0–40)
Albumin/Globulin Ratio: 1.9 (ref 1.2–2.2)
Albumin: 4.3 g/dL (ref 3.9–4.9)
Alkaline Phosphatase: 49 IU/L (ref 44–121)
BUN/Creatinine Ratio: 22 (ref 10–24)
BUN: 21 mg/dL (ref 8–27)
Bilirubin Total: 0.7 mg/dL (ref 0.0–1.2)
CO2: 26 mmol/L (ref 20–29)
Calcium: 9.6 mg/dL (ref 8.6–10.2)
Chloride: 104 mmol/L (ref 96–106)
Creatinine, Ser: 0.95 mg/dL (ref 0.76–1.27)
Globulin, Total: 2.3 g/dL (ref 1.5–4.5)
Glucose: 98 mg/dL (ref 70–99)
Potassium: 4.8 mmol/L (ref 3.5–5.2)
Sodium: 142 mmol/L (ref 134–144)
Total Protein: 6.6 g/dL (ref 6.0–8.5)
eGFR: 90 mL/min/{1.73_m2} (ref >59)

## 2022-04-27 LAB — CBC WITH DIFFERENTIAL/PLATELET
Basophils Absolute: 0.1 10*3/uL (ref 0.0–0.2)
Basos: 2 %
EOS (ABSOLUTE): 0.2 10*3/uL (ref 0.0–0.4)
Eos: 4 %
Hematocrit: 43.9 % (ref 37.5–51.0)
Hemoglobin: 14.4 g/dL (ref 13.0–17.7)
Immature Grans (Abs): 0 10*3/uL (ref 0.0–0.1)
Immature Granulocytes: 1 %
Lymphocytes Absolute: 1.8 10*3/uL (ref 0.7–3.1)
Lymphs: 38 %
MCH: 29.8 pg (ref 26.6–33.0)
MCHC: 32.8 g/dL (ref 31.5–35.7)
MCV: 91 fL (ref 79–97)
Monocytes Absolute: 0.4 10*3/uL (ref 0.1–0.9)
Monocytes: 9 %
Neutrophils Absolute: 2.1 10*3/uL (ref 1.4–7.0)
Neutrophils: 46 %
Platelets: 302 10*3/uL (ref 150–450)
RBC: 4.83 x10E6/uL (ref 4.14–5.80)
RDW: 11.9 % (ref 11.6–15.4)
WBC: 4.6 10*3/uL (ref 3.4–10.8)

## 2022-04-27 LAB — PSA: Prostate Specific Ag, Serum: 0.6 ng/mL (ref 0.0–4.0)

## 2022-04-27 LAB — LIPID PANEL
Chol/HDL Ratio: 3.1 ratio (ref 0.0–5.0)
Cholesterol, Total: 172 mg/dL (ref 100–199)
HDL: 56 mg/dL (ref >39)
LDL Chol Calc (NIH): 103 mg/dL — ABNORMAL HIGH (ref 0–99)
Triglycerides: 66 mg/dL (ref 0–149)
VLDL Cholesterol Cal: 13 mg/dL (ref 5–40)

## 2022-04-29 ENCOUNTER — Other Ambulatory Visit: Payer: Self-pay | Admitting: Family Medicine

## 2022-04-29 ENCOUNTER — Other Ambulatory Visit: Payer: Self-pay | Admitting: *Deleted

## 2022-04-29 ENCOUNTER — Telehealth: Payer: Self-pay | Admitting: *Deleted

## 2022-04-29 DIAGNOSIS — Z86010 Personal history of colonic polyps: Secondary | ICD-10-CM

## 2022-04-29 DIAGNOSIS — E039 Hypothyroidism, unspecified: Secondary | ICD-10-CM

## 2022-04-29 MED ORDER — PEG 3350-KCL-NABCB-NACL-NASULF 236 G PO SOLR: ORAL | 0 refills | Status: DC

## 2022-04-29 NOTE — Telephone Encounter (Signed)
Gastroenterology Pre-Procedure Review  Request Date: 05/29/2022 Requesting Physician: Dr. Vicente Males  PATIENT REVIEW QUESTIONS: The patient responded to the following health history questions as indicated:    1. Are you having any GI issues? no 2. Do you have a personal history of Polyps? Yes (last colonoscopy 05/19/2019) 3. Do you have a family history of Colon Cancer or Polyps? no 4. Diabetes Mellitus? no 5. Joint replacements in the past 12 months?no 6. Major health problems in the past 3 months?no 7. Any artificial heart valves, MVP, or defibrillator?no    MEDICATIONS & ALLERGIES:    Patient reports the following regarding taking any anticoagulation/antiplatelet therapy:   Plavix, Coumadin, Eliquis, Xarelto, Lovenox, Pradaxa, Brilinta, or Effient? no Aspirin? no  Patient confirms/reports the following medications:  Current Outpatient Medications  Medication Sig Dispense Refill   FLOVENT HFA 220 MCG/ACT inhaler During a breath hold, place 2 puff(s) from a multidose inhaler into the mouth and swallow. Then brush your teeth. 1 each 1   latanoprost (XALATAN) 0.005 % ophthalmic solution 1 drop at bedtime.     levothyroxine (SYNTHROID) 100 MCG tablet TAKE 1 TABLET EVERY DAY ON EMPTY STOMACHWITH A GLASS OF WATER AT LEAST 30-60 MINBEFORE BREAKFAST 90 tablet 0   MULTIPLE VITAMIN PO Take by mouth.     OMEGA-3 FATTY ACIDS PO Take by mouth.     omeprazole (PRILOSEC) 40 MG capsule Take 1 capsule (40 mg total) by mouth daily. For 8 weeks only. 30 capsule 1   zolpidem (AMBIEN) 10 MG tablet TAKE ONE TABLET AT BEDTIME IF NEEDED FORSLEEP 30 tablet 3   No current facility-administered medications for this visit.    Patient confirms/reports the following allergies:  No Known Allergies  No orders of the defined types were placed in this encounter.   AUTHORIZATION INFORMATION Primary Insurance: 1D#: Group #:  Secondary Insurance: 1D#: Group #:  SCHEDULE INFORMATION: Date:  05/29/2022 Time: Location: [place]

## 2022-04-29 NOTE — Telephone Encounter (Signed)
Medication Refill - Medication: levothyroxine (SYNTHROID) 100 MCG tablet  Has the patient contacted their pharmacy? Yes.     Preferred Pharmacy (with phone number or street name):  [place], [place] - [place] Phone:  [phone]  Fax:  [phone]     Has the patient been seen for an appointment in the last year OR does the patient have an upcoming appointment? Yes.    The patient does want to know that if the provider wants to adjust his medicine because his recent lab work showed a his TSH as high. Please assist patient further

## 2022-04-30 MED ORDER — LEVOTHYROXINE SODIUM 100 MCG PO TABS: ORAL_TABLET | ORAL | 0 refills | Status: DC

## 2022-04-30 NOTE — Telephone Encounter (Signed)
Requested Prescriptions  Pending Prescriptions Disp Refills  . levothyroxine (SYNTHROID) 100 MCG tablet 90 tablet 0    Sig: TAKE 1 TABLET EVERY DAY ON EMPTY STOMACHWITH A GLASS OF WATER AT LEAST 30-60 [place] BREAKFAST     Endocrinology:  Hypothyroid Agents Failed - 04/29/2022 11:23 AM      Failed - TSH in normal range and within 360 days    TSH  Date Value Ref Range Status  04/26/2022 4.920 (H) 0.450 - 4.500 uIU/mL Final         Passed - Valid encounter within last 12 months    Recent Outpatient Visits          5 days ago Annual physical exam   [facility] [name], PA-C   1 year ago Annual physical exam   [facility] Jerrol Banana., MD   1 year ago Near syncope   [facility] Jerrol Banana., MD   1 year ago Annual physical exam   [facility] Jerrol Banana., MD   2 years ago [place] Jerrol Banana., MD      Future Appointments            In 5 months Ralene Bathe, MD [place]   In 1 year Thedore Mins, Ria Comment, PA-C [facility]

## 2022-05-29 ENCOUNTER — Ambulatory Visit
Admission: RE | Admit: 2022-05-29 | Discharge: 2022-05-29 | Disposition: A | Discharge status: Home / Self Care | Payer: BC Managed Care – PPO | Attending: Gastroenterology | Admitting: Gastroenterology

## 2022-05-29 ENCOUNTER — Ambulatory Visit: Payer: BC Managed Care – PPO | Admitting: Certified Registered"

## 2022-05-29 ENCOUNTER — Encounter: Payer: Self-pay | Admitting: Gastroenterology

## 2022-05-29 ENCOUNTER — Encounter
Admission: RE | Disposition: A | Discharge status: Home / Self Care | Payer: Self-pay | Source: Home / Self Care | Attending: Gastroenterology

## 2022-05-29 DIAGNOSIS — Z1211 Encounter for screening for malignant neoplasm of colon: Secondary | ICD-10-CM | POA: Diagnosis not present

## 2022-05-29 DIAGNOSIS — Z860100 Personal history of colon polyps, unspecified: Secondary | ICD-10-CM

## 2022-05-29 DIAGNOSIS — K648 Other hemorrhoids: Secondary | ICD-10-CM | POA: Diagnosis not present

## 2022-05-29 DIAGNOSIS — E039 Hypothyroidism, unspecified: Secondary | ICD-10-CM | POA: Diagnosis not present

## 2022-05-29 HISTORY — PX: COLONOSCOPY WITH PROPOFOL: SHX5780

## 2022-05-29 SURGERY — COLONOSCOPY WITH PROPOFOL
Anesthesia: General

## 2022-05-29 MED ORDER — GLYCOPYRROLATE 0.2 MG/ML IJ SOLN
INTRAMUSCULAR | Status: DC | PRN
  Administered 2022-05-29: .2 mg via INTRAVENOUS

## 2022-05-29 MED ORDER — PROPOFOL 10 MG/ML IV BOLUS
INTRAVENOUS | Status: DC | PRN
  Administered 2022-05-29: 10 mg via INTRAVENOUS
  Administered 2022-05-29: 70 mg via INTRAVENOUS
  Administered 2022-05-29: 20 mg via INTRAVENOUS

## 2022-05-29 MED ORDER — LIDOCAINE HCL (CARDIAC) PF 100 MG/5ML IV SOSY
PREFILLED_SYRINGE | INTRAVENOUS | Status: DC | PRN
  Administered 2022-05-29: 60 mg via INTRAVENOUS

## 2022-05-29 MED ORDER — PROPOFOL 10 MG/ML IV BOLUS
INTRAVENOUS | Status: AC
  Filled 2022-05-29: qty 20

## 2022-05-29 MED ORDER — PROPOFOL 1000 MG/100ML IV EMUL
INTRAVENOUS | Status: AC
  Filled 2022-05-29: qty 100

## 2022-05-29 MED ORDER — LIDOCAINE HCL (PF) 2 % IJ SOLN
INTRAMUSCULAR | Status: AC
  Filled 2022-05-29: qty 5

## 2022-05-29 MED ORDER — STERILE WATER FOR IRRIGATION IR SOLN
Status: DC | PRN
  Administered 2022-05-29: 50 mL

## 2022-05-29 MED ORDER — PROPOFOL 500 MG/50ML IV EMUL
INTRAVENOUS | Status: DC | PRN
  Administered 2022-05-29: 150 ug/kg/min via INTRAVENOUS

## 2022-05-29 MED ORDER — GLYCOPYRROLATE 0.2 MG/ML IJ SOLN
INTRAMUSCULAR | Status: AC
  Filled 2022-05-29: qty 1

## 2022-05-29 MED ORDER — SODIUM CHLORIDE 0.9 % IV SOLN: INTRAVENOUS | Status: DC

## 2022-05-29 MED ORDER — LACTATED RINGERS IV SOLN: INTRAVENOUS | Status: DC | PRN

## 2022-05-29 NOTE — Anesthesia Postprocedure Evaluation (Signed)
Anesthesia Post Note  Patient: Charles Romero  Procedure(s) Performed: COLONOSCOPY WITH PROPOFOL  Patient location during evaluation: PACU Anesthesia Type: General Level of consciousness: awake and alert Pain management: pain level controlled Vital Signs Assessment: post-procedure vital signs reviewed and stable Respiratory status: spontaneous breathing, nonlabored ventilation, respiratory function stable and patient connected to nasal cannula oxygen Cardiovascular status: blood pressure returned to baseline and stable Postop Assessment: no apparent nausea or vomiting Anesthetic complications: no   No notable events documented.   Last Vitals:  Vitals:   05/29/22 0837 05/29/22 0840  BP: 110/75 123/77  Pulse: (!) 56 (!) 53  Resp: 12 13  Temp:    SpO2: 100% 99%    Last Pain:  Vitals:   05/29/22 0820  TempSrc: Temporal  PainSc:                  Molli Barrows

## 2022-05-29 NOTE — Op Note (Signed)
[facility] Gastroenterology Patient Name: Charles Romero Procedure Date: 05/29/2022 7:57 AM MRN: [id number] Account #: [id number] Date of Birth: Jul 21, 1960 Admit Type: Outpatient Age: 61 Room: [facility] ENDO ROOM 3 Gender: Male Note Status: Finalized Instrument Name: [name] [id number] Procedure:             Colonoscopy Indications:           Surveillance: Personal history of adenomatous polyps                         on last colonoscopy 3 years ago Providers:             Jonathon Bellows MD, MD Medicines:             Monitored Anesthesia Care Complications:         No immediate complications. Procedure:             Pre-Anesthesia Assessment:                        - Prior to the procedure, a History and Physical was                         performed, and patient medications, allergies and                         sensitivities were reviewed. The patient's tolerance                         of previous anesthesia was reviewed.                        - ASA Grade Assessment: II - A patient with mild                         systemic disease.                        After obtaining informed consent, the colonoscope was                         passed under direct vision. Throughout the procedure,                         the patient's blood pressure, pulse, and oxygen                         saturations were monitored continuously. The                         Colonoscope was introduced through the anus and                         advanced to the the cecum, identified by the                         appendiceal orifice. The colonoscopy was performed                         with ease. The patient tolerated the procedure well.  The quality of the bowel preparation was excellent.                         The appendiceal orifice was photographed. Findings:      The perianal and digital rectal examinations were normal.      The entire examined colon appeared normal  on direct and retroflexion       views. Impression:            - The entire examined colon is normal on direct and                         retroflexion views.                        - No specimens collected. Recommendation:        - Discharge patient to home (with escort).                        - Resume previous diet.                        - Continue present medications.                        - Repeat colonoscopy in 5 years for surveillance. Procedure Code(s):     --- Professional ---                        [id number], Colonoscopy, flexible; diagnostic, including                         collection of specimen(s) by brushing or washing, when                         performed (separate procedure) Diagnosis Code(s):     --- Professional ---                        Z86.010, Personal history of colonic polyps CPT copyright 2022 [employer]. All rights reserved. The codes documented in this report are preliminary and upon coder review may  be revised to meet current compliance requirements. Jonathon Bellows, MD Jonathon Bellows MD, MD 05/29/2022 8:18:01 AM This report has been signed electronically. Number of Addenda: 0 Note Initiated On: 05/29/2022 7:57 AM Scope Withdrawal Time: 0 hours 9 minutes 59 seconds  Total Procedure Duration: 0 hours 14 minutes 7 seconds  Estimated Blood Loss:  Estimated blood loss: none.      [facility]

## 2022-05-29 NOTE — H&P (Signed)
Charles Bellows, MD [address], [place], [place], [place], [address] [address], [place], [place], [place], [address] Phone: [phone]  Fax: [phone]  Primary Care Physician:  Jerrol Banana., MD   Pre-Procedure History & Physical: HPI:  Charles Romero is a 61 y.o. male is here for an colonoscopy.   Past Medical History:  Diagnosis Date   Allergy    Cats   Basal cell carcinoma 12/07/2013   R mid dorsum forearm - superficial    Basal cell carcinoma 03/17/2017   L prox dorsum forearm at the elbow near the antecubital - superficial    Cataract 2022   Dysplastic nevus 07/25/2008   L epigastric - moderate   Dysplastic nevus 07/25/2008   upper back right of spine - moderate   Dysplastic nevus 12/15/2008   R buttocks sup lat - mild to moderate   Dysplastic nevus 06/08/2009   R low back medial - mild    Dysplastic nevus 06/08/2009   R low back lat - mild to moderate   Dysplastic nevus 06/08/2009   L mid back paraspinal - mild   Dysplastic nevus 06/08/2009   R epigastric next to midline - severe, excision 08/23/2009   Dysplastic nevus 12/18/2009   LUQA    Dysplastic nevus 12/18/2009   L deltoid    Dysplastic nevus 12/07/2013   R parallel to umbilicus lat abdomen - mild    Dysplastic nevus 04/20/2018   R lower back - moderate   Dysplastic nevus 05/17/2019   R post shoulder at the lat sup scapula - moderate   Glaucoma 2022   Hypothyroidism     Past Surgical History:  Procedure Laterality Date   COLONOSCOPY WITH PROPOFOL N/A 05/19/2019   Procedure: COLONOSCOPY WITH PROPOFOL;  Surgeon: Charles Bellows, MD;  Location: [facility] ENDOSCOPY;  Service: Gastroenterology;  Laterality: N/A;   ESOPHAGOGASTRODUODENOSCOPY (EGD) WITH PROPOFOL N/A 11/27/2021   Procedure: ESOPHAGOGASTRODUODENOSCOPY (EGD) WITH PROPOFOL;  Surgeon: Charles Bellows, MD;  Location: [facility] ENDOSCOPY;  Service: Gastroenterology;  Laterality: N/A;   FRACTURE SURGERY  1977   Collarbone   HEMORRHOID SURGERY      HERNIA REPAIR     ROTATOR CUFF REPAIR     SHOULDER ARTHROSCOPY Left 09/04/2017   rotator cuff repair, torn labrium, bicep and rotator   VASECTOMY      Prior to Admission medications   Medication Sig Start Date End Date Taking? Authorizing Provider  latanoprost (XALATAN) 0.005 % ophthalmic solution 1 drop at bedtime. 04/04/21  Yes [provider]  levothyroxine (SYNTHROID) 100 MCG tablet TAKE 1 TABLET EVERY DAY ON EMPTY STOMACHWITH A GLASS OF WATER AT LEAST 30-60 MINBEFORE BREAKFAST 04/30/22  Yes Drubel, Ria Comment, PA-C  MULTIPLE VITAMIN PO Take by mouth.   Yes [provider]  OMEGA-3 FATTY ACIDS PO Take by mouth.   Yes [provider]  omeprazole (PRILOSEC) 40 MG capsule Take 1 capsule (40 mg total) by mouth daily. For 8 weeks only. 11/29/21  Yes Charles Bellows, MD  FLOVENT [facility] 220 MCG/ACT inhaler During a breath hold, place 2 puff(s) from a multidose inhaler into the mouth and swallow. Then brush your teeth. 12/10/21   Charles Bellows, MD  polyethylene glycol (GOLYTELY) 236 g solution At 5:00 pm the evening before procedure You will need to fill to the line indicated on the container with a clear liquid. You will need to mix very well and drink 8 ounces every 15-20 minutes until (1/2) of the liquid in container has been  completed.On the day of procedure, 5 hours before the scheduled time You will need to drink the rest of the liquid as instructed previously, 8 ounces every 15 minutes until finished within 2 hours. 04/29/22   Charles Bellows, MD  zolpidem (AMBIEN) 10 MG tablet TAKE ONE TABLET AT BEDTIME IF NEEDED FORSLEEP 04/25/22   [name], PA-C    Allergies as of 04/29/2022   (No Known Allergies)    Family History  Problem Relation Age of Onset   Arthritis Mother    Glaucoma Mother    Colon polyps Father    Diabetes Father    Cancer Brother    Kidney cancer Brother     Social History   Socioeconomic History   Marital status: Married    Spouse name: Not on  file   Number of children: Not on file   Years of education: Not on file   Highest education level: Not on file  Occupational History   Not on file  Tobacco Use   Smoking status: Never   Smokeless tobacco: Never  Vaping Use   Vaping Use: Never used  Substance and Sexual Activity   Alcohol use: Yes    Alcohol/week: 6.0 standard drinks of alcohol    Types: 2 Glasses of wine, 4 Cans of beer per week    Comment: 3 drinks a week   Drug use: No   Sexual activity: Yes    Birth control/protection: Surgical    Comment: Monogamous  Other Topics Concern   Not on file  Social History Narrative   Not on file   Social Determinants of Health   Financial Resource Strain: Not on file  Food Insecurity: Not on file  Transportation Needs: Not on file  Physical Activity: Not on file  Stress: Not on file  Social Connections: Not on file  Intimate Partner Violence: Not on file    Review of Systems: See HPI, otherwise negative ROS  Physical Exam: BP 119/79   Pulse 64   Temp (!) 96.3 F (35.7 C) (Temporal)   Resp 16   Ht 6' (1.829 m)   Wt 80.7 kg   SpO2 98%   BMI 24.14 kg/m  General:   Alert,  pleasant and cooperative in NAD Head:  Normocephalic and atraumatic. Neck:  Supple; no masses or thyromegaly. Lungs:  Clear throughout to auscultation, normal respiratory effort.    Heart:  +S1, +S2, Regular rate and rhythm, No edema. Abdomen:  Soft, nontender and nondistended. Normal bowel sounds, without guarding, and without rebound.   Neurologic:  Alert and  oriented x4;  grossly normal neurologically.  Impression/Plan: Charles Romero is here for an colonoscopy to be performed for surveillance due to prior history of colon polyps   Risks, benefits, limitations, and alternatives regarding  colonoscopy have been reviewed with the patient.  Questions have been answered.  All parties agreeable.   Charles Bellows, MD  05/29/2022, 7:56 AM

## 2022-05-29 NOTE — Anesthesia Preprocedure Evaluation (Signed)
Anesthesia Evaluation  Patient identified by MRN, date of birth, ID band Patient awake    Reviewed: Allergy & Precautions, H&P , NPO status , Patient's Chart, lab work & pertinent test results, reviewed documented beta blocker date and time   Airway Mallampati: II   Neck ROM: full    Dental  (+) Poor Dentition   Pulmonary neg pulmonary ROS   Pulmonary exam normal        Cardiovascular Exercise Tolerance: Good negative cardio ROS Normal cardiovascular exam Rhythm:regular Rate:Normal     Neuro/Psych negative neurological ROS  negative psych ROS   GI/Hepatic negative GI ROS, Neg liver ROS,,,  Endo/Other  Hypothyroidism    Renal/GU negative Renal ROS  negative genitourinary   Musculoskeletal   Abdominal   Peds  Hematology negative hematology ROS (+)   Anesthesia Other Findings Past Medical History: No date: Allergy     Comment:  Cats 12/07/2013: Basal cell carcinoma     Comment:  R mid dorsum forearm - superficial  03/17/2017: Basal cell carcinoma     Comment:  L prox dorsum forearm at the elbow near the antecubital               - superficial  2022: Cataract 07/25/2008: Dysplastic nevus     Comment:  L epigastric - moderate 07/25/2008: Dysplastic nevus     Comment:  upper back right of spine - moderate 12/15/2008: Dysplastic nevus     Comment:  R buttocks sup lat - mild to moderate 06/08/2009: Dysplastic nevus     Comment:  R low back medial - mild  06/08/2009: Dysplastic nevus     Comment:  R low back lat - mild to moderate 06/08/2009: Dysplastic nevus     Comment:  L mid back paraspinal - mild 06/08/2009: Dysplastic nevus     Comment:  R epigastric next to midline - severe, excision               08/23/2009 12/18/2009: Dysplastic nevus     Comment:  LUQA  12/18/2009: Dysplastic nevus     Comment:  L deltoid  12/07/2013: Dysplastic nevus     Comment:  R parallel to umbilicus lat abdomen - mild   04/20/2018: Dysplastic nevus     Comment:  R lower back - moderate 05/17/2019: Dysplastic nevus     Comment:  R post shoulder at the lat sup scapula - moderate 2022: Glaucoma No date: Hypothyroidism Past Surgical History: 05/19/2019: COLONOSCOPY WITH PROPOFOL; N/A     Comment:  Procedure: COLONOSCOPY WITH PROPOFOL;  Surgeon: Jonathon Bellows, MD;  Location: [facility] ENDOSCOPY;  Service:               Gastroenterology;  Laterality: N/A; 11/27/2021: ESOPHAGOGASTRODUODENOSCOPY (EGD) WITH PROPOFOL; N/A     Comment:  Procedure: ESOPHAGOGASTRODUODENOSCOPY (EGD) WITH               PROPOFOL;  Surgeon: Jonathon Bellows, MD;  Location: [facility]               ENDOSCOPY;  Service: Gastroenterology;  Laterality: N/A; 1977: FRACTURE SURGERY     Comment:  Collarbone No date: HEMORRHOID SURGERY No date: HERNIA REPAIR No date: ROTATOR CUFF REPAIR 09/04/2017: SHOULDER ARTHROSCOPY; Left     Comment:  rotator cuff repair, torn labrium, bicep and rotator No date: VASECTOMY BMI    Body Mass Index: 24.14 kg/m     Reproductive/Obstetrics negative OB  ROS                             Anesthesia Physical Anesthesia Plan  ASA: 2  Anesthesia Plan: General   Post-op Pain Management:    Induction:   PONV Risk Score and Plan:   Airway Management Planned:   Additional Equipment:   Intra-op Plan:   Post-operative Plan:   Informed Consent: I have reviewed the patients History and Physical, chart, labs and discussed the procedure including the risks, benefits and alternatives for the proposed anesthesia with the patient or authorized representative who has indicated his/her understanding and acceptance.     Dental Advisory Given  Plan Discussed with: CRNA  Anesthesia Plan Comments:        Anesthesia Quick Evaluation

## 2022-05-29 NOTE — Transfer of Care (Signed)
Immediate Anesthesia Transfer of Care Note  Patient: Charles Romero  Procedure(s) Performed: COLONOSCOPY WITH PROPOFOL  Patient Location: PACU  Anesthesia Type:MAC  Level of Consciousness: awake, alert , oriented, and patient cooperative  Airway & Oxygen Therapy: Patient Spontanous Breathing and Patient connected to nasal cannula oxygen  Post-op Assessment: Report given to RN and Post -op Vital signs reviewed and stable  Post vital signs: Reviewed and stable  Last Vitals:  Vitals Value Taken Time  BP    Temp    Pulse    Resp 8 05/29/22 0818  SpO2    Vitals shown include unvalidated device data.  Last Pain:  Vitals:   05/29/22 0723  TempSrc: Temporal  PainSc: 0-No pain         Complications: No notable events documented.

## 2022-05-30 ENCOUNTER — Encounter: Payer: Self-pay | Admitting: Gastroenterology

## 2022-07-29 ENCOUNTER — Other Ambulatory Visit: Payer: Self-pay | Admitting: Physician Assistant

## 2022-07-29 DIAGNOSIS — E039 Hypothyroidism, unspecified: Secondary | ICD-10-CM

## 2022-10-03 ENCOUNTER — Ambulatory Visit (INDEPENDENT_AMBULATORY_CARE_PROVIDER_SITE_OTHER): Payer: BC Managed Care – PPO | Admitting: Dermatology

## 2022-10-03 VITALS — BP 128/71 | HR 69

## 2022-10-03 DIAGNOSIS — L570 Actinic keratosis: Secondary | ICD-10-CM

## 2022-10-03 DIAGNOSIS — L820 Inflamed seborrheic keratosis: Secondary | ICD-10-CM

## 2022-10-03 DIAGNOSIS — Z86018 Personal history of other benign neoplasm: Secondary | ICD-10-CM | POA: Diagnosis not present

## 2022-10-03 DIAGNOSIS — Z85828 Personal history of other malignant neoplasm of skin: Secondary | ICD-10-CM | POA: Diagnosis not present

## 2022-10-03 DIAGNOSIS — D1801 Hemangioma of skin and subcutaneous tissue: Secondary | ICD-10-CM

## 2022-10-03 DIAGNOSIS — D229 Melanocytic nevi, unspecified: Secondary | ICD-10-CM

## 2022-10-03 DIAGNOSIS — Z5111 Encounter for antineoplastic chemotherapy: Secondary | ICD-10-CM

## 2022-10-03 DIAGNOSIS — L821 Other seborrheic keratosis: Secondary | ICD-10-CM

## 2022-10-03 DIAGNOSIS — L814 Other melanin hyperpigmentation: Secondary | ICD-10-CM

## 2022-10-03 DIAGNOSIS — Z1283 Encounter for screening for malignant neoplasm of skin: Secondary | ICD-10-CM | POA: Diagnosis not present

## 2022-10-03 DIAGNOSIS — L578 Other skin changes due to chronic exposure to nonionizing radiation: Secondary | ICD-10-CM

## 2022-10-03 DIAGNOSIS — Z79899 Other long term (current) drug therapy: Secondary | ICD-10-CM

## 2022-10-03 MED ORDER — FLUOROURACIL 5 % EX CREA: TOPICAL_CREAM | Freq: Two times a day (BID) | CUTANEOUS | 2 refills | Status: DC

## 2022-10-03 NOTE — Patient Instructions (Addendum)
Cryotherapy Aftercare  Wash gently with soap and water everyday.   Apply Vaseline and Band-Aid daily until healed.        5-Fluorouracil/Calcipotriene Patient Education   Actinic keratoses are the dry, red scaly spots on the skin caused by sun damage. A portion of these spots can turn into skin cancer with time, and treating them can help prevent development of skin cancer.   Treatment of these spots requires removal of the defective skin cells. There are various ways to remove actinic keratoses, including freezing with liquid nitrogen, treatment with creams, or treatment with a blue light procedure in the office.   5-fluorouracil cream is a topical cream used to treat actinic keratoses. It works by interfering with the growth of abnormal fast-growing skin cells, such as actinic keratoses. These cells peel off and are replaced by healthy ones.   5-fluorouracil/calcipotriene is a combination of the 5-fluorouracil cream with a vitamin D analog cream called calcipotriene. The calcipotriene alone does not treat actinic keratoses. However, when it is combined with 5-fluorouracil, it helps the 5-fluorouracil treat the actinic keratoses much faster so that the same results can be achieved with a much shorter treatment time.  INSTRUCTIONS FOR 5-FLUOROURACIL/CALCIPOTRIENE CREAM:   5-fluorouracil/calcipotriene cream typically only needs to be used for 4-7 days. A thin layer should be applied twice a day to the treatment areas recommended by your physician.   If your physician prescribed you separate tubes of 5-fluourouracil and calcipotriene, apply a thin layer of 5-fluorouracil followed by a thin layer of calcipotriene.   Avoid contact with your eyes, nostrils, and mouth. Do not use 5-fluorouracil/calcipotriene cream on infected or open wounds.   You will develop redness, irritation and some crusting at areas where you have pre-cancer damage/actinic keratoses. IF YOU DEVELOP PAIN, BLEEDING, OR  SIGNIFICANT CRUSTING, STOP THE TREATMENT EARLY - you have already gotten a good response and the actinic keratoses should clear up well.  Wash your hands after applying 5-fluorouracil 5% cream on your skin.   A moisturizer or sunscreen with a minimum SPF 30 should be applied each morning.   Once you have finished the treatment, you can apply a thin layer of Vaseline twice a day to irritated areas to soothe and calm the areas more quickly. If you experience significant discomfort, contact your physician.  For some patients it is necessary to repeat the treatment for best results.  SIDE EFFECTS: When using 5-fluorouracil/calcipotriene cream, you may have mild irritation, such as redness, dryness, swelling, or a mild burning sensation. This usually resolves within 2 weeks. The more actinic keratoses you have, the more redness and inflammation you can expect during treatment. Eye irritation has been reported rarely. If this occurs, please let us know.  If you have any trouble using this cream, please call the office. If you have any other questions about this information, please do not hesitate to ask me before you leave the office.       Instructions for [facility] Medications  One or more of your medications was sent to the [facility] mail order compounding pharmacy. You will receive an email from them and can purchase the medicine through that link. It will then be mailed to your home at the address you confirmed. If for any reason you do not receive an email from them, please check your spam folder. If you still do not find the email, please let us know. [facility] phone number is [phone].  Due to recent changes in healthcare laws, you may see results of your pathology and/or laboratory studies on MyChart before the doctors have had a chance to review them. We understand that in some cases there may be results that are confusing or concerning to you. Please  understand that not all results are received at the same time and often the doctors may need to interpret multiple results in order to provide you with the best plan of care or course of treatment. Therefore, we ask that you please give [place] 2 business days to thoroughly review all your results before contacting the office for clarification. Should we see a critical lab result, you will be contacted sooner.   If You Need Anything After Your Visit  If you have any questions or concerns for your doctor, please call our main line at [phone] and press option 4 to reach your doctor's medical assistant. If no one answers, please leave a voicemail as directed and we will return your call as soon as possible. Messages left after 4 pm will be answered the following business day.   You may also send [place] a message via [place]. We typically respond to MyChart messages within 1-2 business days.  For prescription refills, please ask your pharmacy to contact our office. Our fax number is [phone].  If you have an urgent issue when the clinic is closed that cannot wait until the next business day, you can page your doctor at the number below.    Please note that while we do our best to be available for urgent issues outside of office hours, we are not available 24/7.   If you have an urgent issue and are unable to reach [place], you may choose to seek medical care at your doctor's office, retail clinic, urgent care center, or emergency room.  If you have a medical emergency, please immediately call 911 or go to the emergency department.  Pager Numbers  - Dr. Nehemiah Massed: [phone]  - Dr. Laurence Ferrari: [phone]  - Dr. Nicole Kindred: [phone]  In the event of inclement weather, please call our main line at [phone] for an update on the status of any delays or closures.  Dermatology Medication Tips: Please keep the boxes that topical medications come in in order to help keep track of the instructions about  where and how to use these. Pharmacies typically print the medication instructions only on the boxes and not directly on the medication tubes.   If your medication is too expensive, please contact our office at [phone] option 4 or send [place] a message through [place].   We are unable to tell what your co-pay for medications will be in advance as this is different depending on your insurance coverage. However, we may be able to find a substitute medication at lower cost or fill out paperwork to get insurance to cover a needed medication.   If a prior authorization is required to get your medication covered by your insurance company, please allow [place] 1-2 business days to complete this process.  Drug prices often vary depending on where the prescription is filled and some pharmacies may offer cheaper prices.  The website www.goodrx.com contains coupons for medications through different pharmacies. The prices here do not account for what the cost may be with help from insurance (it may be cheaper with your insurance), but the website can give you the price if you did not use any insurance.  - You can print the associated coupon and take it  with your prescription to the pharmacy.  - You may also stop by our office during regular business hours and pick up a GoodRx coupon card.  - If you need your prescription sent electronically to a different pharmacy, notify our office through [facility] or by phone at [phone] option 4.     Si Usted Necesita Algo Despus de Su Visita  Tambin puede enviarnos un mensaje a travs de Pharmacist, community. Por lo general respondemos a los mensajes de MyChart en el transcurso de 1 a 2 das hbiles.  Para renovar recetas, por favor pida a su farmacia que se ponga en contacto con nuestra oficina. Harland Dingwall de fax es [place] [phone].  Si tiene un asunto urgente cuando la clnica est cerrada y que no puede esperar hasta el siguiente da hbil, puede  llamar/localizar a su doctor(a) al nmero que aparece a continuacin.   Por favor, tenga en cuenta que aunque hacemos todo lo posible para estar disponibles para asuntos urgentes fuera del horario de [place], no estamos disponibles las 24 horas del da, los 7 das de la [place].   Si tiene un problema urgente y no puede comunicarse con nosotros, puede optar por buscar atencin mdica  en el consultorio de su doctor(a), en una clnica privada, en un centro de atencin urgente o en una sala de emergencias.  Si tiene Engineering geologist, por favor llame inmediatamente al 911 o vaya a la sala de emergencias.  Nmeros de bper  - Dr. Nehemiah Massed: [phone]  - Dra. Moye: [phone]  - Dra. Nicole Kindred: [phone]  En caso de inclemencias del [place], por favor llame a Johnsie Kindred principal al [phone] para una actualizacin sobre el [place] de cualquier retraso o cierre.  Consejos para la medicacin en dermatologa: Por favor, guarde las cajas en las que vienen los medicamentos de uso tpico para ayudarle a seguir las instrucciones sobre dnde y cmo usarlos. Las farmacias generalmente imprimen las instrucciones del medicamento slo en las cajas y no directamente en los tubos del [place].   Si su medicamento es muy caro, por favor, pngase en contacto con Zigmund Daniel llamando al [phone] y presione la opcin 4 o envenos un mensaje a travs de Pharmacist, community.   No podemos decirle cul ser su copago por los medicamentos por adelantado ya que esto es diferente dependiendo de la cobertura de su seguro. Sin embargo, es posible que podamos encontrar un medicamento sustituto a Electrical engineer un formulario para que el seguro cubra el medicamento que se considera necesario.   Si se requiere una autorizacin previa para que su compaa de seguros [place] su medicamento, por favor permtanos de 1 a 2 das hbiles para completar este proceso.  Los precios de los medicamentos varan con  frecuencia dependiendo del Environmental consultant de dnde se surte la receta y alguna farmacias pueden ofrecer precios ms baratos.  El sitio web www.goodrx.com tiene cupones para medicamentos de Airline pilot. Los precios aqu no tienen en cuenta lo que podra costar con la ayuda del seguro (puede ser ms barato con su seguro), pero el sitio web puede darle el precio si no utiliz Research scientist (physical sciences).  - Puede imprimir el cupn correspondiente y llevarlo con su receta a la farmacia.  - Tambin puede pasar por nuestra oficina durante el horario de atencin regular y Charity fundraiser una tarjeta de cupones de GoodRx.  - Si necesita que su receta se enve electrnicamente a Chiropodist, informe a nuestra oficina a travs de MyChart de [facility] o por telfono llamando al [phone] y presione la opcin 4.

## 2022-10-03 NOTE — Progress Notes (Signed)
Follow-Up Visit   Subjective  Charles Romero is a 62 y.o. male who presents for the following: Skin Cancer Screening and Full Body Skin Exam  The patient presents for Total-Body Skin Exam (TBSE) for skin cancer screening and mole check. The patient has spots, moles and lesions to be evaluated, some may be new or changing and the patient has concerns that these could be cancer.    The following portions of the chart were reviewed this encounter and updated as appropriate: medications, allergies, medical history  Review of Systems:  No other skin or systemic complaints except as noted in HPI or Assessment and Plan.  Objective  Well appearing patient in no apparent distress; mood and affect are within normal limits.  A full examination was performed including scalp, head, eyes, ears, nose, lips, neck, chest, axillae, abdomen, back, buttocks, bilateral upper extremities, bilateral lower extremities, hands, feet, fingers, toes, fingernails, and toenails. All findings within normal limits unless otherwise noted below.   Relevant physical exam findings are noted in the Assessment and Plan.    Assessment & Plan   LENTIGINES, SEBORRHEIC KERATOSES, HEMANGIOMAS - Benign normal skin lesions - Benign-appearing - Call for any changes  MELANOCYTIC NEVI - Tan-brown and/or pink-flesh-colored symmetric macules and papules - Benign appearing on exam today - Observation - Call clinic for new or changing moles - Recommend daily use of broad spectrum spf 30+ sunscreen to sun-exposed areas.    Actinic Damage - Severe, confluent actinic changes with pre-cancerous actinic keratoses  - Severe, chronic, not at goal, secondary to cumulative UV radiation exposure over time - diffuse scaly erythematous macules and papules with underlying dyspigmentation - Discussed Prescription "Field Treatment" for Severe, Chronic Confluent Actinic Changes with Pre-Cancerous Actinic Keratoses Field treatment involves  treatment of an entire area of skin that has confluent Actinic Changes (Sun/ Ultraviolet light damage) and PreCancerous Actinic Keratoses by method of PhotoDynamic Therapy (PDT) and/or prescription Topical Chemotherapy agents such as 5-fluorouracil, 5-fluorouracil/calcipotriene, and/or imiquimod.  The purpose is to decrease the number of clinically evident and subclinical PreCancerous lesions to prevent progression to development of skin cancer by chemically destroying early precancer changes that may or may not be visible.  It has been shown to reduce the risk of developing skin cancer in the treated area. As a result of treatment, redness, scaling, crusting, and open sores may occur during treatment course. One or more than one of these methods may be used and may have to be used several times to control, suppress and eliminate the PreCancerous changes. Discussed treatment course, expected reaction, and possible side effects. - Recommend daily broad spectrum sunscreen SPF 30+ to sun-exposed areas, reapply every 2 hours as needed.  - Staying in the shade or wearing long sleeves, sun glasses (UVA+UVB protection) and wide brim hats (4-inch brim around the entire circumference of the hat) are also recommended. - Call for new or changing lesions.   - Start 5-fluorouracil/calcipotriene cream twice a day for 7 days to affected areas including scalp. Prescription sent to [facility]. Patient advised they will receive an email to purchase the medication online and have it sent to their home. Patient provided with handout reviewing treatment course and side effects and advised to call or message [place] on MyChart with any concerns.   ACTINIC KERATOSIS Exam: Erythematous thin papules/macules with gritty scale  Actinic keratoses are precancerous spots that appear secondary to cumulative UV radiation exposure/sun exposure over time. They are chronic with expected duration over  1 year. A portion  of actinic keratoses will progress to squamous cell carcinoma of the skin. It is not possible to reliably predict which spots will progress to skin cancer and so treatment is recommended to prevent development of skin cancer.  Recommend daily broad spectrum sunscreen SPF 30+ to sun-exposed areas, reapply every 2 hours as needed.  Recommend staying in the shade or wearing long sleeves, sun glasses (UVA+UVB protection) and wide brim hats (4-inch brim around the entire circumference of the hat). Call for new or changing lesions.  Treatment Plan:  Prior to procedure, discussed risks of blister formation, small wound, skin dyspigmentation, or rare scar following cryotherapy. Recommend Vaseline ointment to treated areas while healing.  Destruction Procedure Note Destruction method: cryotherapy   Informed consent: discussed and consent obtained   Lesion destroyed using liquid nitrogen: Yes   Outcome: patient tolerated procedure well with no complications   Post-procedure details: wound care instructions given   Locations: ears # of Lesions Treated: 5    INFLAMED SEBORRHEIC KERATOSIS Exam: Erythematous keratotic or waxy stuck-on papule or plaque.  Symptomatic, irritating, patient would like treated.  Benign-appearing.  Call clinic for new or changing lesions.   Prior to procedure, discussed risks of blister formation, small wound, skin dyspigmentation, or rare scar following treatment. Recommend Vaseline ointment to treated areas while healing.  Destruction Procedure Note Destruction method: cryotherapy   Informed consent: discussed and consent obtained   Lesion destroyed using liquid nitrogen: Yes   Outcome: patient tolerated procedure well with no complications   Post-procedure details: wound care instructions given   Locations: left arm # of Lesions Treated: 2    HISTORY OF DYSPLASTIC NEVUS Multiple see history  No evidence of recurrence today Recommend regular full body skin  exams Recommend daily broad spectrum sunscreen SPF 30+ to sun-exposed areas, reapply every 2 hours as needed.  Call if any new or changing lesions are noted between office visits   HISTORY OF BASAL CELL CARCINOMA OF THE SKIN Multiple see history  - No evidence of recurrence today - Recommend regular full body skin exams - Recommend daily broad spectrum sunscreen SPF 30+ to sun-exposed areas, reapply every 2 hours as needed.  - Call if any new or changing lesions are noted between office visits   SKIN CANCER SCREENING PERFORMED TODAY.   Return in about 6 months (around 04/04/2023) for TBSE, hx of BCC, hx of Dysplastic nevus .  IMarye Round, CMA, am acting as scribe for Sarina Ser, MD .   Documentation: I have reviewed the above documentation for accuracy and completeness, and I agree with the above.  Sarina Ser, MD

## 2022-10-08 ENCOUNTER — Encounter: Payer: Self-pay | Admitting: Dermatology

## 2022-10-10 ENCOUNTER — Ambulatory Visit: Payer: BC Managed Care – PPO | Admitting: Dermatology

## 2022-12-16 ENCOUNTER — Other Ambulatory Visit: Payer: Self-pay | Admitting: Gastroenterology

## 2022-12-16 MED ORDER — OMEPRAZOLE 40 MG PO CPDR: 40.0000 mg | DELAYED_RELEASE_CAPSULE | Freq: Every day | ORAL | 3 refills | Status: AC

## 2023-04-23 ENCOUNTER — Ambulatory Visit: Payer: BC Managed Care – PPO | Admitting: Dermatology

## 2023-04-23 DIAGNOSIS — L820 Inflamed seborrheic keratosis: Secondary | ICD-10-CM | POA: Diagnosis not present

## 2023-04-23 DIAGNOSIS — D1801 Hemangioma of skin and subcutaneous tissue: Secondary | ICD-10-CM

## 2023-04-23 DIAGNOSIS — L578 Other skin changes due to chronic exposure to nonionizing radiation: Secondary | ICD-10-CM

## 2023-04-23 DIAGNOSIS — D229 Melanocytic nevi, unspecified: Secondary | ICD-10-CM

## 2023-04-23 DIAGNOSIS — L814 Other melanin hyperpigmentation: Secondary | ICD-10-CM | POA: Diagnosis not present

## 2023-04-23 DIAGNOSIS — W908XXA Exposure to other nonionizing radiation, initial encounter: Secondary | ICD-10-CM | POA: Diagnosis not present

## 2023-04-23 DIAGNOSIS — Z1283 Encounter for screening for malignant neoplasm of skin: Secondary | ICD-10-CM

## 2023-04-23 DIAGNOSIS — L821 Other seborrheic keratosis: Secondary | ICD-10-CM

## 2023-04-23 DIAGNOSIS — Z872 Personal history of diseases of the skin and subcutaneous tissue: Secondary | ICD-10-CM

## 2023-04-23 DIAGNOSIS — L570 Actinic keratosis: Secondary | ICD-10-CM | POA: Diagnosis not present

## 2023-04-23 DIAGNOSIS — Z85828 Personal history of other malignant neoplasm of skin: Secondary | ICD-10-CM

## 2023-04-23 DIAGNOSIS — Z86018 Personal history of other benign neoplasm: Secondary | ICD-10-CM

## 2023-04-23 NOTE — Progress Notes (Signed)
Follow-Up Visit   Subjective  Charles Romero is a 62 y.o. male who presents for the following: Skin Cancer Screening and Full Body Skin Exam hx of bcc, hx of dysplastic, hx of aks , hx of isks Used 5 f/u cream to scalp and did get very red and inflamed at areas.   Rough spots at scalp and left forearm   The patient presents for Total-Body Skin Exam (TBSE) for skin cancer screening and mole check. The patient has spots, moles and lesions to be evaluated, some may be new or changing and the patient may have concern these could be cancer.    The following portions of the chart were reviewed this encounter and updated as appropriate: medications, allergies, medical history  Review of Systems:  No other skin or systemic complaints except as noted in HPI or Assessment and Plan.  Objective  Well appearing patient in no apparent distress; mood and affect are within normal limits.  A full examination was performed including scalp, head, eyes, ears, nose, lips, neck, chest, axillae, abdomen, back, buttocks, bilateral upper extremities, bilateral lower extremities, hands, feet, fingers, toes, fingernails, and toenails. All findings within normal limits unless otherwise noted below.   Relevant physical exam findings are noted in the Assessment and Plan.  left forearm x 4, left lateral forehead x 1 (5) Erythematous stuck-on, waxy papule or plaque     scalp and face x 8 (8) Erythematous thin papules/macules with gritty scale.     Assessment & Plan   SKIN CANCER SCREENING PERFORMED TODAY.  ACTINIC DAMAGE - chronic, secondary to cumulative UV radiation exposure/sun exposure over time - diffuse scaly erythematous macules with underlying dyspigmentation - Recommend daily broad spectrum sunscreen SPF 30+ to sun-exposed areas, reapply every 2 hours as needed.  - Recommend staying in the shade or wearing long sleeves, sun glasses (UVA+UVB protection) and wide brim hats (4-inch brim around  the entire circumference of the hat). - Call for new or changing lesions.    LENTIGINES, SEBORRHEIC KERATOSES, HEMANGIOMAS - Benign normal skin lesions - Benign-appearing - Call for any changes  MELANOCYTIC NEVI - Tan-brown and/or pink-flesh-colored symmetric macules and papules - Benign appearing on exam today - Observation - Call clinic for new or changing moles - Recommend daily use of broad spectrum spf 30+ sunscreen to sun-exposed areas.    HISTORY OF BASAL CELL CARCINOMA OF THE SKIN See history  - No evidence of recurrence today - Recommend regular full body skin exams - Recommend daily broad spectrum sunscreen SPF 30+ to sun-exposed areas, reapply every 2 hours as needed.  - Call if any new or changing lesions are noted between office visits  HISTORY OF DYSPLASTIC NEVUS See history No evidence of recurrence today Recommend regular full body skin exams Recommend daily broad spectrum sunscreen SPF 30+ to sun-exposed areas, reapply every 2 hours as needed.  Call if any new or changing lesions are noted between office visits    Inflamed seborrheic keratosis (5) left forearm x 4, left lateral forehead x 1  Symptomatic, irritating, patient would like treated.  Will treat with Ln2 and recheck spot at left lateral forehead at next visit see photos   Destruction of lesion - left forearm x 4, left lateral forehead x 1 (5) Complexity: simple   Destruction method: cryotherapy   Informed consent: discussed and consent obtained   Timeout:  patient name, date of birth, surgical site, and procedure verified Lesion destroyed using liquid nitrogen: Yes   Region  frozen until ice ball extended beyond lesion: Yes   Outcome: patient tolerated procedure well with no complications   Post-procedure details: wound care instructions given    Actinic keratosis (8) scalp and face x 8  Actinic keratoses are precancerous spots that appear secondary to cumulative UV radiation exposure/sun  exposure over time. They are chronic with expected duration over 1 year. A portion of actinic keratoses will progress to squamous cell carcinoma of the skin. It is not possible to reliably predict which spots will progress to skin cancer and so treatment is recommended to prevent development of skin cancer.  Recommend daily broad spectrum sunscreen SPF 30+ to sun-exposed areas, reapply every 2 hours as needed.  Recommend staying in the shade or wearing long sleeves, sun glasses (UVA+UVB protection) and wide brim hats (4-inch brim around the entire circumference of the hat). Call for new or changing lesions.  Destruction of lesion - scalp and face x 8 (8) Complexity: simple   Destruction method: cryotherapy   Informed consent: discussed and consent obtained   Timeout:  patient name, date of birth, surgical site, and procedure verified Lesion destroyed using liquid nitrogen: Yes   Region frozen until ice ball extended beyond lesion: Yes   Outcome: patient tolerated procedure well with no complications   Post-procedure details: wound care instructions given     Return in about 7 months (around 11/21/2023) for TBSE.  IAsher Muir, CMA, am acting as scribe for Armida Sans, MD.   Documentation: I have reviewed the above documentation for accuracy and completeness, and I agree with the above.  Armida Sans, MD

## 2023-04-23 NOTE — Patient Instructions (Addendum)

## 2023-04-29 DIAGNOSIS — J309 Allergic rhinitis, unspecified: Secondary | ICD-10-CM | POA: Diagnosis not present

## 2023-04-29 DIAGNOSIS — Z23 Encounter for immunization: Secondary | ICD-10-CM | POA: Diagnosis not present

## 2023-04-29 DIAGNOSIS — Z125 Encounter for screening for malignant neoplasm of prostate: Secondary | ICD-10-CM | POA: Diagnosis not present

## 2023-04-29 DIAGNOSIS — Z0000 Encounter for general adult medical examination without abnormal findings: Secondary | ICD-10-CM | POA: Diagnosis not present

## 2023-04-29 DIAGNOSIS — E039 Hypothyroidism, unspecified: Secondary | ICD-10-CM | POA: Diagnosis not present

## 2023-04-29 DIAGNOSIS — E7800 Pure hypercholesterolemia, unspecified: Secondary | ICD-10-CM | POA: Diagnosis not present

## 2023-05-01 ENCOUNTER — Encounter: Payer: BC Managed Care – PPO | Admitting: Physician Assistant

## 2023-05-04 ENCOUNTER — Encounter: Payer: Self-pay | Admitting: Dermatology

## 2023-05-06 ENCOUNTER — Other Ambulatory Visit: Payer: Self-pay | Admitting: Family Medicine

## 2023-05-06 DIAGNOSIS — Z0000 Encounter for general adult medical examination without abnormal findings: Secondary | ICD-10-CM

## 2023-05-06 DIAGNOSIS — Z136 Encounter for screening for cardiovascular disorders: Secondary | ICD-10-CM

## 2023-05-16 ENCOUNTER — Ambulatory Visit
Admission: RE | Admit: 2023-05-16 | Discharge: 2023-05-16 | Disposition: A | Discharge status: Home / Self Care | Payer: Self-pay | Source: Ambulatory Visit | Attending: Family Medicine | Admitting: Family Medicine

## 2023-05-16 ENCOUNTER — Other Ambulatory Visit: Payer: Self-pay | Admitting: Physician Assistant

## 2023-05-16 DIAGNOSIS — Z0000 Encounter for general adult medical examination without abnormal findings: Secondary | ICD-10-CM | POA: Insufficient documentation

## 2023-05-16 DIAGNOSIS — E039 Hypothyroidism, unspecified: Secondary | ICD-10-CM

## 2023-05-16 DIAGNOSIS — Z136 Encounter for screening for cardiovascular disorders: Secondary | ICD-10-CM | POA: Insufficient documentation

## 2023-08-14 ENCOUNTER — Other Ambulatory Visit: Payer: Self-pay | Admitting: Physician Assistant

## 2023-08-14 DIAGNOSIS — E039 Hypothyroidism, unspecified: Secondary | ICD-10-CM

## 2023-11-26 ENCOUNTER — Ambulatory Visit: Payer: BC Managed Care – PPO | Admitting: Dermatology

## 2023-11-26 ENCOUNTER — Encounter: Payer: Self-pay | Admitting: Dermatology

## 2023-11-26 DIAGNOSIS — L570 Actinic keratosis: Secondary | ICD-10-CM | POA: Diagnosis not present

## 2023-11-26 DIAGNOSIS — L814 Other melanin hyperpigmentation: Secondary | ICD-10-CM | POA: Diagnosis not present

## 2023-11-26 DIAGNOSIS — L821 Other seborrheic keratosis: Secondary | ICD-10-CM

## 2023-11-26 DIAGNOSIS — L820 Inflamed seborrheic keratosis: Secondary | ICD-10-CM | POA: Diagnosis not present

## 2023-11-26 DIAGNOSIS — Z79899 Other long term (current) drug therapy: Secondary | ICD-10-CM

## 2023-11-26 DIAGNOSIS — Z7189 Other specified counseling: Secondary | ICD-10-CM

## 2023-11-26 DIAGNOSIS — Z1283 Encounter for screening for malignant neoplasm of skin: Secondary | ICD-10-CM | POA: Diagnosis not present

## 2023-11-26 DIAGNOSIS — W908XXA Exposure to other nonionizing radiation, initial encounter: Secondary | ICD-10-CM

## 2023-11-26 DIAGNOSIS — Z872 Personal history of diseases of the skin and subcutaneous tissue: Secondary | ICD-10-CM

## 2023-11-26 DIAGNOSIS — L578 Other skin changes due to chronic exposure to nonionizing radiation: Secondary | ICD-10-CM | POA: Diagnosis not present

## 2023-11-26 DIAGNOSIS — D1801 Hemangioma of skin and subcutaneous tissue: Secondary | ICD-10-CM

## 2023-11-26 DIAGNOSIS — Z86018 Personal history of other benign neoplasm: Secondary | ICD-10-CM

## 2023-11-26 DIAGNOSIS — D229 Melanocytic nevi, unspecified: Secondary | ICD-10-CM

## 2023-11-26 DIAGNOSIS — Z85828 Personal history of other malignant neoplasm of skin: Secondary | ICD-10-CM

## 2023-11-26 DIAGNOSIS — Z5111 Encounter for antineoplastic chemotherapy: Secondary | ICD-10-CM

## 2023-11-26 MED ORDER — FLUOROURACIL 5 % EX CREA: TOPICAL_CREAM | CUTANEOUS | 2 refills | Status: AC

## 2023-11-26 NOTE — Progress Notes (Signed)
 Follow-Up Visit   Subjective  Charles Romero is a 63 y.o. male who presents for the following: Skin Cancer Screening and Full Body Skin Exam. HxBCC, HxDN, HxAKs.   Areas of concern on arms and hands. Stung by jelly fish while diving. 57 stings. Thinks some of these may be secondary to that.   The patient presents for Total-Body Skin Exam (TBSE) for skin cancer screening and mole check. The patient has spots, moles and lesions to be evaluated, some may be new or changing and the patient may have concern these could be cancer.  The following portions of the chart were reviewed this encounter and updated as appropriate: medications, allergies, medical history  Review of Systems:  No other skin or systemic complaints except as noted in HPI or Assessment and Plan.  Objective  Well appearing patient in no apparent distress; mood and affect are within normal limits.  A full examination was performed including scalp, head, eyes, ears, nose, lips, neck, chest, axillae, abdomen, back, buttocks, bilateral upper extremities, bilateral lower extremities, hands, feet, fingers, toes, fingernails, and toenails. All findings within normal limits unless otherwise noted below.   Relevant physical exam findings are noted in the Assessment and Plan.  Scalp, ears, face x10 (10) Erythematous thin papules/macules with gritty scale.  arms and hands x15 (15) Erythematous keratotic or waxy stuck-on papule or plaque.  Assessment & Plan   SKIN CANCER SCREENING PERFORMED TODAY.  HISTORY OF BASAL CELL CARCINOMA OF THE SKIN - No evidence of recurrence today - Recommend regular full body skin exams - Recommend daily broad spectrum sunscreen SPF 30+ to sun-exposed areas, reapply every 2 hours as needed.  - Call if any new or changing lesions are noted between office visits  HISTORY OF DYSPLASTIC NEVUS No evidence of recurrence today Recommend regular full body skin exams Recommend daily broad spectrum  sunscreen SPF 30+ to sun-exposed areas, reapply every 2 hours as needed.  Call if any new or changing lesions are noted between office visits  ACTINIC DAMAGE WITH PRECANCEROUS ACTINIC KERATOSES Counseling for Topical Chemotherapy Management: Patient exhibits: - Severe, confluent actinic changes with pre-cancerous actinic keratoses that is secondary to cumulative UV radiation exposure over time - Condition that is severe; chronic, not at goal. - diffuse scaly erythematous macules and papules with underlying dyspigmentation - Discussed Prescription "Field Treatment" topical Chemotherapy for Severe, Chronic Confluent Actinic Changes with Pre-Cancerous Actinic Keratoses Field treatment involves treatment of an entire area of skin that has confluent Actinic Changes (Sun/ Ultraviolet light damage) and PreCancerous Actinic Keratoses by method of PhotoDynamic Therapy (PDT) and/or prescription Topical Chemotherapy agents such as 5-fluorouracil , 5-fluorouracil /calcipotriene, and/or imiquimod.  The purpose is to decrease the number of clinically evident and subclinical PreCancerous lesions to prevent progression to development of skin cancer by chemically destroying early precancer changes that may or may not be visible.  It has been shown to reduce the risk of developing skin cancer in the treated area. As a result of treatment, redness, scaling, crusting, and open sores may occur during treatment course. One or more than one of these methods may be used and may have to be used several times to control, suppress and eliminate the PreCancerous changes. Discussed treatment course, expected reaction, and possible side effects. - Recommend daily broad spectrum sunscreen SPF 30+ to sun-exposed areas, reapply every 2 hours as needed.  - Staying in the shade or wearing long sleeves, sun glasses (UVA+UVB protection) and wide brim hats (4-inch brim around the entire  circumference of the hat) are also recommended. - Call  for new or changing lesions.  Starting in September 2025 - Start 5-fluorouracil /calcipotriene cream twice a day for 7 days to affected areas including rims of ears and scalp. Prescription sent to [facility]. Patient advised they will receive an email to purchase the medication online and have it sent to their home. Patient provided with handout reviewing treatment course and side effects and advised to call or message us  on MyChart with any concerns.  Reviewed course of treatment and expected reaction.  Patient advised to expect inflammation and crusting and advised that erosions are possible.  Patient advised to be diligent with sun protection during and after treatment. Counseled to keep medication out of reach of children and pets.  LENTIGINES, SEBORRHEIC KERATOSES, HEMANGIOMAS - Benign normal skin lesions - Benign-appearing - Call for any changes  MELANOCYTIC NEVI - Tan-brown and/or pink-flesh-colored symmetric macules and papules - Benign appearing on exam today - Observation - Call clinic for new or changing moles - Recommend daily use of broad spectrum spf 30+ sunscreen to sun-exposed areas.   AK (ACTINIC KERATOSIS) (10) Scalp, ears, face x10 (10) Actinic keratoses are precancerous spots that appear secondary to cumulative UV radiation exposure/sun exposure over time. They are chronic with expected duration over 1 year. A portion of actinic keratoses will progress to squamous cell carcinoma of the skin. It is not possible to reliably predict which spots will progress to skin cancer and so treatment is recommended to prevent development of skin cancer.  Recommend daily broad spectrum sunscreen SPF 30+ to sun-exposed areas, reapply every 2 hours as needed.  Recommend staying in the shade or wearing long sleeves, sun glasses (UVA+UVB protection) and wide brim hats (4-inch brim around the entire circumference of the hat). Call for new or changing  lesions. Destruction of lesion - Scalp, ears, face x10 (10) Complexity: simple   Destruction method: cryotherapy   Informed consent: discussed and consent obtained   Timeout:  patient name, date of birth, surgical site, and procedure verified Lesion destroyed using liquid nitrogen: Yes   Region frozen until ice ball extended beyond lesion: Yes   Outcome: patient tolerated procedure well with no complications   Post-procedure details: wound care instructions given   Additional details:  Prior to procedure, discussed risks of blister formation, small wound, skin dyspigmentation, or rare scar following cryotherapy. Recommend Vaseline ointment to treated areas while healing.  INFLAMED SEBORRHEIC KERATOSIS (15) arms and hands x15 (15) Symptomatic, irritating, patient would like treated. Destruction of lesion - arms and hands x15 (15) Complexity: simple   Destruction method: cryotherapy   Informed consent: discussed and consent obtained   Timeout:  patient name, date of birth, surgical site, and procedure verified Lesion destroyed using liquid nitrogen: Yes   Region frozen until ice ball extended beyond lesion: Yes   Outcome: patient tolerated procedure well with no complications   Post-procedure details: wound care instructions given   Additional details:  Prior to procedure, discussed risks of blister formation, small wound, skin dyspigmentation, or rare scar following cryotherapy. Recommend Vaseline ointment to treated areas while healing.   Return in about 6 months (around 05/28/2024) for AK Follow Up, sun exposed exam.  I, Darcie Easterly, CMA, am acting as scribe for Celine Collard, MD.   Documentation: I have reviewed the above documentation for accuracy and completeness, and I agree with the above.  Celine Collard, MD

## 2023-11-26 NOTE — Patient Instructions (Addendum)
 Cryotherapy Aftercare  Wash gently with soap and water  everyday.   Apply Vaseline Jelly daily until healed.    Starting in September 2025 - Start 5-fluorouracil /calcipotriene cream twice a day for 7 days to affected areas including rims of ears and scalp. Prescription sent to [facility]. Patient advised they will receive an email to purchase the medication online and have it sent to their home. Patient provided with handout reviewing treatment course and side effects and advised to call or message us  on MyChart with any concerns.  Reviewed course of treatment and expected reaction.  Patient advised to expect inflammation and crusting and advised that erosions are possible.  Patient advised to be diligent with sun protection during and after treatment. Counseled to keep medication out of reach of children and pets.    Recommend daily broad spectrum sunscreen SPF 30+ to sun-exposed areas, reapply every 2 hours as needed. Call for new or changing lesions.  Staying in the shade or wearing long sleeves, sun glasses (UVA+UVB protection) and wide brim hats (4-inch brim around the entire circumference of the hat) are also recommended for sun protection.     Melanoma ABCDEs  Melanoma is the most dangerous type of skin cancer, and is the leading cause of death from skin disease.  You are more likely to develop melanoma if you: Have light-colored skin, light-colored eyes, or red or blond hair Spend a lot of time in the sun Tan regularly, either outdoors or in a tanning bed Have had blistering sunburns, especially during childhood Have a close family member who has had a melanoma Have atypical moles or large birthmarks  Early detection of melanoma is key since treatment is typically straightforward and cure rates are extremely high if we catch it early.   The first sign of melanoma is often a change in a mole or a new dark spot.  The ABCDE system is a way of remembering the  signs of melanoma.  A for asymmetry:  The two halves do not match. B for border:  The edges of the growth are irregular. C for color:  A mixture of colors are present instead of an even brown color. D for diameter:  Melanomas are usually (but not always) greater than 6mm - the size of a pencil eraser. E for evolution:  The spot keeps changing in size, shape, and color.  Please check your skin once per month between visits. You can use a small mirror in front and a large mirror behind you to keep an eye on the back side or your body.   If you see any new or changing lesions before your next follow-up, please call to schedule a visit.  Please continue daily skin protection including broad spectrum sunscreen SPF 30+ to sun-exposed areas, reapplying every 2 hours as needed when you're outdoors.   Staying in the shade or wearing long sleeves, sun glasses (UVA+UVB protection) and wide brim hats (4-inch brim around the entire circumference of the hat) are also recommended for sun protection.       Due to recent changes in healthcare laws, you may see results of your pathology and/or laboratory studies on MyChart before the doctors have had a chance to review them. We understand that in some cases there may be results that are confusing or concerning to you. Please understand that not all results are received at the same time and often the doctors may need to interpret multiple results in order to provide you with  the best plan of care or course of treatment. Therefore, we ask that you please give us  2 business days to thoroughly review all your results before contacting the office for clarification. Should we see a critical lab result, you will be contacted sooner.   If You Need Anything After Your Visit  If you have any questions or concerns for your doctor, please call our main line at [phone] and press option 4 to reach your doctor's medical assistant. If no one answers, please leave a  voicemail as directed and we will return your call as soon as possible. Messages left after 4 pm will be answered the following business day.   You may also send us  a message via MyChart. We typically respond to MyChart messages within 1-2 business days.  For prescription refills, please ask your pharmacy to contact our office. Our fax number is [phone].  If you have an urgent issue when the clinic is closed that cannot wait until the next business day, you can page your doctor at the number below.    Please note that while we do our best to be available for urgent issues outside of office hours, we are not available 24/7.   If you have an urgent issue and are unable to reach us , you may choose to seek medical care at your doctor's office, retail clinic, urgent care center, or emergency room.  If you have a medical emergency, please immediately call 911 or go to the emergency department.  Pager Numbers  - Dr. Bary Likes: [phone]  - Dr. Annette Barters: [phone]  - Dr. Felipe Horton: [phone]   In the event of inclement weather, please call our main line at [phone] for an update on the status of any delays or closures.  Dermatology Medication Tips: Please keep the boxes that topical medications come in in order to help keep track of the instructions about where and how to use these. Pharmacies typically print the medication instructions only on the boxes and not directly on the medication tubes.   If your medication is too expensive, please contact our office at [phone] option 4 or send us  a message through MyChart.   We are unable to tell what your co-pay for medications will be in advance as this is different depending on your insurance coverage. However, we may be able to find a substitute medication at lower cost or fill out paperwork to get insurance to cover a needed medication.   If a prior authorization is required to get your medication covered by your insurance  company, please allow us  1-2 business days to complete this process.  Drug prices often vary depending on where the prescription is filled and some pharmacies may offer cheaper prices.  The website www.goodrx.com contains coupons for medications through different pharmacies. The prices here do not account for what the cost may be with help from insurance (it may be cheaper with your insurance), but the website can give you the price if you did not use any insurance.  - You can print the associated coupon and take it with your prescription to the pharmacy.  - You may also stop by our office during regular business hours and pick up a GoodRx coupon card.  - If you need your prescription sent electronically to a different pharmacy, notify our office through Charles Romero or by phone at [phone] option 4.     Si Usted Necesita Algo Despus de Su Visita  Tambin puede enviarnos un mensaje a Charles Romero  de MyChart. Por lo general respondemos a los mensajes de MyChart en el transcurso de 1 a 2 das hbiles.  Para renovar recetas, por favor pida a su farmacia que se ponga en contacto con nuestra oficina. Charles Romero de fax es Charles Romero [phone].  Si tiene un asunto urgente cuando la clnica est cerrada y que no puede esperar hasta el siguiente da hbil, puede llamar/localizar a su doctor(a) al nmero que aparece a continuacin.   Por favor, tenga en cuenta que aunque hacemos todo lo posible para estar disponibles para asuntos urgentes fuera del horario de Charles Romero, no estamos disponibles las 24 horas del da, los 7 [address]  [address] de la [place].   Si tiene un problema urgente y no puede comunicarse con nosotros, puede optar por buscar atencin mdica  en el consultorio de su doctor(a), en una clnica privada, en un centro de atencin urgente o en una sala de emergencias.  Si tiene Engineer, drilling, por favor llame inmediatamente al 911 o vaya a la sala de emergencias.  Nmeros de bper  - Dr.  Bary Likes: [phone]  - Dra. Annette Barters: [phone]  - Dr. Felipe Horton: [phone]   En caso de inclemencias del tiempo, por favor llame a Charles Romero principal al [phone] para una actualizacin sobre el Charles Romero de cualquier retraso o cierre.  Consejos para la medicacin en dermatologa: Por favor, guarde las cajas en las que vienen los medicamentos de uso tpico para ayudarle a seguir las instrucciones sobre dnde y cmo usarlos. Las farmacias generalmente imprimen las instrucciones del medicamento slo en las cajas y no directamente en los tubos del Charles Romero.   Si su medicamento es muy caro, por favor, pngase en contacto con Charles Romero llamando al [phone] y presione la opcin 4 o envenos un mensaje a travs de Clinical cytogeneticist.   No podemos decirle cul ser su copago por los medicamentos por adelantado ya que esto es diferente dependiendo de la cobertura de su seguro. Sin embargo, es posible que podamos encontrar un medicamento sustituto a Audiological scientist un formulario para que el seguro cubra el medicamento que se considera necesario.   Si se requiere una autorizacin previa para que su compaa de seguros [place] su medicamento, por favor permtanos de 1 a 2 das hbiles para completar este proceso.  Los precios de los medicamentos varan con frecuencia dependiendo del Environmental consultant de dnde se surte la receta y alguna farmacias pueden ofrecer precios ms baratos.  El sitio web www.goodrx.com tiene cupones para medicamentos de Health and safety inspector. Los precios aqu no tienen en cuenta lo que podra costar con la ayuda del seguro (puede ser ms barato con su seguro), pero el sitio web puede darle el precio si no utiliz Tourist information centre manager.  - Puede imprimir el cupn correspondiente y llevarlo con su receta a la farmacia.  - Tambin puede pasar por nuestra oficina durante el horario de atencin regular y Education officer, museum una tarjeta de cupones de GoodRx.  - Si necesita que su receta se enve  electrnicamente a una farmacia diferente, informe a nuestra oficina a travs de MyChart de [place] o por telfono llamando al [phone] y presione la opcin 4.

## 2024-05-20 ENCOUNTER — Ambulatory Visit: Admitting: Dermatology

## 2024-05-20 DIAGNOSIS — L570 Actinic keratosis: Secondary | ICD-10-CM | POA: Diagnosis not present

## 2024-05-20 DIAGNOSIS — Z7189 Other specified counseling: Secondary | ICD-10-CM

## 2024-05-20 DIAGNOSIS — L578 Other skin changes due to chronic exposure to nonionizing radiation: Secondary | ICD-10-CM | POA: Diagnosis not present

## 2024-05-20 DIAGNOSIS — D492 Neoplasm of unspecified behavior of bone, soft tissue, and skin: Secondary | ICD-10-CM | POA: Diagnosis not present

## 2024-05-20 DIAGNOSIS — W908XXA Exposure to other nonionizing radiation, initial encounter: Secondary | ICD-10-CM

## 2024-05-20 NOTE — Patient Instructions (Addendum)

## 2024-05-20 NOTE — Progress Notes (Signed)
   Follow-Up Visit   Subjective  Charles Romero is a 63 y.o. male who presents for the following: 6 months f/u on precancers on his scalp and face, treated with 5FU/Calcipotriene cream 2 weeks ago with a good response. The patient has spots, moles and lesions to be evaluated, some may be new or changing and the patient may have concern these could be cancer.   The following portions of the chart were reviewed this encounter and updated as appropriate: medications, allergies, medical history  Review of Systems:  No other skin or systemic complaints except as noted in HPI or Assessment and Plan.  Objective  Well appearing patient in no apparent distress; mood and affect are within normal limits.  A focused examination was performed of the following areas:face, scalp, arms, hands   Relevant exam findings are noted in the Assessment and Plan.  scalp x 2 (2) Erythematous thin papules/macules with gritty scale.  left scalp and left face Pink crust    Assessment & Plan   AK (ACTINIC KERATOSIS) (2) scalp x 2 (2) ACTINIC DAMAGE - chronic, secondary to cumulative UV radiation exposure/sun exposure over time - diffuse scaly erythematous macules with underlying dyspigmentation - Recommend daily broad spectrum sunscreen SPF 30+ to sun-exposed areas, reapply every 2 hours as needed.  - Recommend staying in the shade or wearing long sleeves, sun glasses (UVA+UVB protection) and wide brim hats (4-inch brim around the entire circumference of the hat). - Call for new or changing lesions.  Destruction of lesion - scalp x 2 (2) Complexity: simple   Destruction method: cryotherapy   Informed consent: discussed and consent obtained   Timeout:  patient name, date of birth, surgical site, and procedure verified Lesion destroyed using liquid nitrogen: Yes   Region frozen until ice ball extended beyond lesion: Yes   Outcome: patient tolerated procedure well with no complications   Post-procedure  details: wound care instructions given    NEOPLASM OF SKIN left scalp and left face See photo  could be resolving areas from recent Efudex  treatment from which he is still healing. Recheck at next office visit, may consider biopsy if persistent/suspicious  Return in about 2 months (around 07/20/2024) for recheck left scalp and left face.  IFay Kirks, CMA, am acting as scribe for Alm Rhyme, MD .   Documentation: I have reviewed the above documentation for accuracy and completeness, and I agree with the above.  Alm Rhyme, MD

## 2024-05-24 ENCOUNTER — Encounter: Payer: Self-pay | Admitting: Dermatology

## 2024-05-26 ENCOUNTER — Ambulatory Visit (INDEPENDENT_AMBULATORY_CARE_PROVIDER_SITE_OTHER): Admitting: Urology

## 2024-05-26 ENCOUNTER — Encounter: Payer: Self-pay | Admitting: Urology

## 2024-05-26 VITALS — BP 114/77 | HR 67 | Ht 73.0 in | Wt 185.0 lb

## 2024-05-26 DIAGNOSIS — N486 Induration penis plastica: Secondary | ICD-10-CM

## 2024-05-26 LAB — URINALYSIS, COMPLETE
Bilirubin, UA: NEGATIVE
Glucose, UA: NEGATIVE
Ketones, UA: NEGATIVE
Leukocytes,UA: NEGATIVE
Nitrite, UA: NEGATIVE
Protein,UA: NEGATIVE
RBC, UA: NEGATIVE
Specific Gravity, UA: 1.015 (ref 1.005–1.030)
Urobilinogen, Ur: 0.2 mg/dL (ref 0.2–1.0)
pH, UA: 7 (ref 5.0–7.5)

## 2024-05-26 LAB — MICROSCOPIC EXAMINATION: Bacteria, UA: NONE SEEN

## 2024-05-26 MED ORDER — MELOXICAM 15 MG PO TABS: 15.0000 mg | ORAL_TABLET | Freq: Every day | ORAL | 0 refills | Status: AC

## 2024-05-26 NOTE — Progress Notes (Signed)
 05/26/2024 8:29 AM   [name] [birth year] [id number]  Referring provider: Bertrum Charlie CROME, MD [address] [place],  [place] [address]  Chief Complaint  Patient presents with   Other    HPI: Charles Romero is a 63 y.o. male referred for evaluation of dyspareunia.  88-month history of penile pain with erection.  Pain has been bothersome enough that he finds the erection difficult to maintain.  No painful ejaculation or bothersome lower urinary tract symptoms. No curvature with erections He does have Dupuytren's contracture   PMH: Past Medical History:  Diagnosis Date   Allergy     Cats   Basal cell carcinoma 12/07/2013   R mid dorsum forearm - superficial    Basal cell carcinoma 03/17/2017   L prox dorsum forearm at the elbow near the antecubital - superficial    Cataract 2022   Dysplastic nevus 07/25/2008   L epigastric - moderate   Dysplastic nevus 07/25/2008   upper back right of spine - moderate   Dysplastic nevus 12/15/2008   R buttocks sup lat - mild to moderate   Dysplastic nevus 06/08/2009   R low back medial - mild    Dysplastic nevus 06/08/2009   R low back lat - mild to moderate   Dysplastic nevus 06/08/2009   L mid back paraspinal - mild   Dysplastic nevus 06/08/2009   R epigastric next to midline - severe, excision 08/23/2009   Dysplastic nevus 12/18/2009   LUQA    Dysplastic nevus 12/18/2009   L deltoid    Dysplastic nevus 12/07/2013   R parallel to umbilicus lat abdomen - mild    Dysplastic nevus 04/20/2018   R lower back - moderate   Dysplastic nevus 05/17/2019   R post shoulder at the lat sup scapula - moderate   Glaucoma 2022   Hypothyroidism     Surgical History: Past Surgical History:  Procedure Laterality Date   COLONOSCOPY WITH PROPOFOL  N/A 05/19/2019   Procedure: COLONOSCOPY WITH PROPOFOL ;  Surgeon: Therisa Bi, MD;  Location: [facility] ENDOSCOPY;  Service: Gastroenterology;  Laterality: N/A;   COLONOSCOPY WITH PROPOFOL  N/A  05/29/2022   Procedure: COLONOSCOPY WITH PROPOFOL ;  Surgeon: Therisa Bi, MD;  Location: [facility] ENDOSCOPY;  Service: Gastroenterology;  Laterality: N/A;   ESOPHAGOGASTRODUODENOSCOPY (EGD) WITH PROPOFOL  N/A 11/27/2021   Procedure: ESOPHAGOGASTRODUODENOSCOPY (EGD) WITH PROPOFOL ;  Surgeon: Therisa Bi, MD;  Location: [facility] ENDOSCOPY;  Service: Gastroenterology;  Laterality: N/A;   FRACTURE SURGERY  1977   Collarbone   HEMORRHOID SURGERY     HERNIA REPAIR     ROTATOR CUFF REPAIR     SHOULDER ARTHROSCOPY Left 09/04/2017   rotator cuff repair, torn labrium, bicep and rotator   VASECTOMY      Home Medications:  Allergies as of 05/26/2024   No Known Allergies      Medication List        Accurate as of May 26, 2024  8:29 AM. If you have any questions, ask your nurse or doctor.          fluorouracil  5 % cream Commonly known as: EFUDEX  Apply to scalp and rims of ears twice a day for 7 days   latanoprost 0.005 % ophthalmic solution Commonly known as: XALATAN 1 drop at bedtime.   levothyroxine  100 MCG tablet Commonly known as: SYNTHROID  TAKE 1 TABLET EVERY DAY ON EMPTY STOMACHWITH A GLASS OF WATER  AT LEAST 30-60 MINBEFORE BREAKFAST   MULTIPLE VITAMIN PO Take by mouth.   OMEGA-3 FATTY  ACIDS PO Take by mouth.   omeprazole  40 MG capsule Commonly known as: PRILOSEC Take 1 capsule (40 mg total) by mouth daily.   zolpidem  10 MG tablet Commonly known as: AMBIEN  TAKE ONE TABLET AT BEDTIME IF NEEDED FORSLEEP        Allergies: No Known Allergies  Family History: Family History  Problem Relation Age of Onset   Arthritis Mother    Glaucoma Mother    Colon polyps Father    Diabetes Father    Cancer Brother    Kidney cancer Brother     Social History:  reports that he has never smoked. He has never used smokeless tobacco. He reports current alcohol use of about 6.0 standard drinks of alcohol per week. He reports that he does not use drugs.   Physical Exam: BP  114/77   Pulse 67   Ht 6' 1 (1.854 m)   Wt 185 lb (83.9 kg)   BMI 24.41 kg/m   Constitutional:  Alert, No acute distress. HEENT:  AT Respiratory: Normal respiratory effort, no increased work of breathing. GU: Phallus circumcised without lesions.  ~1.5 cm dorsal plaque distal shaft which is tender.  Testes descended bilaterally Psychiatric: Normal mood and affect.  Laboratory Data:  Urinalysis Dipstick/microscopy negative   Assessment & Plan:    1.  Peyronie's disease We discussed penile pain with erections is a common presentation of acute phase Peyronie's disease.  We also discussed the association between Dupuytren's contracture and Peyronie's disease He currently has no curvature with erections and we discussed this may not occur We discussed the duration of acute phase Peyronie's typically is 3-6 months and his pain will eventually resolve Will start meloxicam  15 mg daily x 30 days If no improvement consider a trial of pentoxifylline    Chrishonda Hesch C Branna Cortina, MD  [facility] [place] [address] [place], [place] [address] [phone]

## 2024-07-20 ENCOUNTER — Ambulatory Visit: Admitting: Dermatology

## 2024-07-20 ENCOUNTER — Encounter: Payer: Self-pay | Admitting: Dermatology

## 2024-07-20 DIAGNOSIS — L578 Other skin changes due to chronic exposure to nonionizing radiation: Secondary | ICD-10-CM | POA: Diagnosis not present

## 2024-07-20 DIAGNOSIS — C4491 Basal cell carcinoma of skin, unspecified: Secondary | ICD-10-CM

## 2024-07-20 DIAGNOSIS — C4441 Basal cell carcinoma of skin of scalp and neck: Secondary | ICD-10-CM | POA: Diagnosis not present

## 2024-07-20 DIAGNOSIS — D492 Neoplasm of unspecified behavior of bone, soft tissue, and skin: Secondary | ICD-10-CM | POA: Diagnosis not present

## 2024-07-20 DIAGNOSIS — W908XXA Exposure to other nonionizing radiation, initial encounter: Secondary | ICD-10-CM

## 2024-07-20 HISTORY — DX: Basal cell carcinoma of skin, unspecified: C44.91

## 2024-07-20 NOTE — Progress Notes (Signed)
" ° °  Follow-Up Visit   Subjective  Charles Romero is a 64 y.o. male who presents for the following: 2 months f/u on the left scalp and left. The patient has spots, moles and lesions to be evaluated, some may be new or changing and the patient may have concern these could be cancer.   The following portions of the chart were reviewed this encounter and updated as appropriate: medications, allergies, medical history  Review of Systems:  No other skin or systemic complaints except as noted in HPI or Assessment and Plan.  Objective  Well appearing patient in no apparent distress; mood and affect are within normal limits.  A focused examination was performed of the following areas: Face,scalp  Relevant exam findings are noted in the Assessment and Plan.  left scalp temporal area 1.5 cm irregular textured plaque with some crusting      Assessment & Plan  NEOPLASM OF SKIN left scalp temporal area - Skin / nail biopsy Type of biopsy: tangential   Informed consent: discussed and consent obtained   Patient was prepped and draped in usual sterile fashion: area prepped with alochol. Anesthesia: the lesion was anesthetized in a standard fashion   Anesthetic:  1% lidocaine  w/ epinephrine 1-100,000 buffered w/ 8.4% NaHCO3 Instrument used: flexible razor blade   Hemostasis achieved with: pressure, aluminum chloride and electrodesiccation   Outcome: patient tolerated procedure well   Post-procedure details: wound care instructions given   Post-procedure details comment:  Ointment and small bandage  Specimen 1 - Surgical pathology Differential Diagnosis: R/O Skin cancer   Check Margins: No  ACTINIC DAMAGE Scalp and face (specifically Left face) otherwise clear today. - chronic, secondary to cumulative UV radiation exposure/sun exposure over time - diffuse scaly erythematous macules with underlying dyspigmentation - Recommend daily broad spectrum sunscreen SPF 30+ to sun-exposed areas,  reapply every 2 hours as needed.  - Recommend staying in the shade or wearing long sleeves, sun glasses (UVA+UVB protection) and wide brim hats (4-inch brim around the entire circumference of the hat). - Call for new or changing lesions.    Return in about 6 months (around 01/17/2025) for TBSE, hx of Dysplastic nevus .  IFay Kirks, CMA, am acting as scribe for Alm Rhyme, MD .   Documentation: I have reviewed the above documentation for accuracy and completeness, and I agree with the above.  Alm Rhyme, MD    "

## 2024-07-20 NOTE — Patient Instructions (Addendum)

## 2024-07-22 LAB — SURGICAL PATHOLOGY

## 2024-07-27 ENCOUNTER — Encounter: Payer: Self-pay | Admitting: Dermatology

## 2024-07-27 ENCOUNTER — Ambulatory Visit: Payer: Self-pay | Admitting: Dermatology

## 2024-07-27 DIAGNOSIS — C4441 Basal cell carcinoma of skin of scalp and neck: Secondary | ICD-10-CM

## 2024-07-27 NOTE — Telephone Encounter (Addendum)
 Called and discussed results and scheduled Mohs referral for patient  Referral sent to Dr. Armando  office   ----- Message from Alm Rhyme, MD sent at 07/27/2024 11:58 AM EST ----- FINAL DIAGNOSIS        1. Skin, left scalp temporal area :       BASAL CELL CARCINOMA, NODULAR AND INFILTRATIVE PATTERNS    Cancer = BCC Infiltrative Schedule for MOHS

## 2024-09-02 ENCOUNTER — Encounter: Admitting: Dermatology

## 2025-01-19 ENCOUNTER — Ambulatory Visit: Admitting: Dermatology
